# Patient Record
Sex: Male | Born: 1945 | Race: White | Hispanic: No | Marital: Married | Smoking: Current every day smoker
Health system: Southern US, Community
[De-identification: ages and names within clinical notes are randomized; demographics above are authoritative.]

## PROBLEM LIST (undated history)

## (undated) DIAGNOSIS — Z125 Encounter for screening for malignant neoplasm of prostate: Secondary | ICD-10-CM

## (undated) DIAGNOSIS — J449 Chronic obstructive pulmonary disease, unspecified: Secondary | ICD-10-CM

## (undated) DIAGNOSIS — J4489 Other specified chronic obstructive pulmonary disease: Secondary | ICD-10-CM

## (undated) DIAGNOSIS — J309 Allergic rhinitis, unspecified: Secondary | ICD-10-CM

## (undated) DIAGNOSIS — K5792 Diverticulitis of intestine, part unspecified, without perforation or abscess without bleeding: Secondary | ICD-10-CM

## (undated) DIAGNOSIS — R05 Cough: Secondary | ICD-10-CM

## (undated) DIAGNOSIS — C649 Malignant neoplasm of unspecified kidney, except renal pelvis: Secondary | ICD-10-CM

## (undated) DIAGNOSIS — G4700 Insomnia, unspecified: Secondary | ICD-10-CM

## (undated) DIAGNOSIS — F988 Other specified behavioral and emotional disorders with onset usually occurring in childhood and adolescence: Secondary | ICD-10-CM

## (undated) DIAGNOSIS — F329 Major depressive disorder, single episode, unspecified: Secondary | ICD-10-CM

## (undated) DIAGNOSIS — F17200 Nicotine dependence, unspecified, uncomplicated: Secondary | ICD-10-CM

## (undated) DIAGNOSIS — K219 Gastro-esophageal reflux disease without esophagitis: Secondary | ICD-10-CM

## (undated) DIAGNOSIS — J189 Pneumonia, unspecified organism: Secondary | ICD-10-CM

## (undated) DIAGNOSIS — G4730 Sleep apnea, unspecified: Secondary | ICD-10-CM

## (undated) DIAGNOSIS — Z8719 Personal history of other diseases of the digestive system: Secondary | ICD-10-CM

## (undated) DIAGNOSIS — H409 Unspecified glaucoma: Secondary | ICD-10-CM

## (undated) DIAGNOSIS — I10 Essential (primary) hypertension: Secondary | ICD-10-CM

## (undated) DIAGNOSIS — E7800 Pure hypercholesterolemia, unspecified: Secondary | ICD-10-CM

## (undated) DIAGNOSIS — R059 Cough, unspecified: Secondary | ICD-10-CM

## (undated) DIAGNOSIS — F909 Attention-deficit hyperactivity disorder, unspecified type: Secondary | ICD-10-CM

## (undated) DIAGNOSIS — F3289 Other specified depressive episodes: Secondary | ICD-10-CM

## (undated) DIAGNOSIS — F1011 Alcohol abuse, in remission: Secondary | ICD-10-CM

## (undated) DIAGNOSIS — M1990 Unspecified osteoarthritis, unspecified site: Secondary | ICD-10-CM

## (undated) DIAGNOSIS — F419 Anxiety disorder, unspecified: Secondary | ICD-10-CM

## (undated) DIAGNOSIS — Z0000 Encounter for general adult medical examination without abnormal findings: Secondary | ICD-10-CM

## (undated) DIAGNOSIS — Z8489 Family history of other specified conditions: Secondary | ICD-10-CM

## (undated) DIAGNOSIS — K5900 Constipation, unspecified: Secondary | ICD-10-CM

## (undated) DIAGNOSIS — R0602 Shortness of breath: Secondary | ICD-10-CM

## (undated) HISTORY — DX: Unspecified osteoarthritis, unspecified site: M19.90

## (undated) HISTORY — DX: Insomnia, unspecified: G47.00

## (undated) HISTORY — PX: EYE SURGERY: SHX253

## (undated) HISTORY — DX: Other specified depressive episodes: F32.89

## (undated) HISTORY — DX: Pure hypercholesterolemia, unspecified: E78.00

## (undated) HISTORY — DX: Allergic rhinitis, unspecified: J30.9

## (undated) HISTORY — DX: Malignant neoplasm of unspecified kidney, except renal pelvis: C64.9

## (undated) HISTORY — DX: Other specified chronic obstructive pulmonary disease: J44.89

## (undated) HISTORY — DX: Chronic obstructive pulmonary disease, unspecified: J44.9

## (undated) HISTORY — DX: Nicotine dependence, unspecified, uncomplicated: F17.200

## (undated) HISTORY — PX: TONSILLECTOMY: SUR1361

## (undated) HISTORY — DX: Encounter for screening for malignant neoplasm of prostate: Z12.5

## (undated) HISTORY — PX: APPENDECTOMY: SHX54

## (undated) HISTORY — DX: Encounter for general adult medical examination without abnormal findings: Z00.00

## (undated) HISTORY — DX: Cough: R05

## (undated) HISTORY — DX: Gastro-esophageal reflux disease without esophagitis: K21.9

## (undated) HISTORY — PX: OTHER SURGICAL HISTORY: SHX169

## (undated) HISTORY — DX: Other specified behavioral and emotional disorders with onset usually occurring in childhood and adolescence: F98.8

## (undated) HISTORY — DX: Diverticulitis of intestine, part unspecified, without perforation or abscess without bleeding: K57.92

## (undated) HISTORY — DX: Essential (primary) hypertension: I10

## (undated) HISTORY — PX: BACK SURGERY: SHX140

## (undated) HISTORY — DX: Cough, unspecified: R05.9

## (undated) HISTORY — DX: Alcohol abuse, in remission: F10.11

## (undated) HISTORY — DX: Major depressive disorder, single episode, unspecified: F32.9

## (undated) HISTORY — PX: INGUINAL HERNIA REPAIR: SUR1180

---

## 1999-01-18 ENCOUNTER — Encounter: Payer: Self-pay | Admitting: Family Medicine

## 1999-02-26 ENCOUNTER — Ambulatory Visit (HOSPITAL_COMMUNITY): Admission: RE | Admit: 1999-02-26 | Discharge: 1999-02-26 | Payer: Self-pay | Admitting: Neurosurgery

## 1999-02-26 ENCOUNTER — Encounter: Payer: Self-pay | Admitting: Neurosurgery

## 1999-04-13 ENCOUNTER — Encounter: Payer: Self-pay | Admitting: Neurosurgery

## 1999-04-14 ENCOUNTER — Ambulatory Visit (HOSPITAL_COMMUNITY): Admission: RE | Admit: 1999-04-14 | Discharge: 1999-04-15 | Payer: Self-pay | Admitting: Neurosurgery

## 1999-04-14 ENCOUNTER — Encounter: Payer: Self-pay | Admitting: Neurosurgery

## 1999-06-01 ENCOUNTER — Ambulatory Visit (HOSPITAL_COMMUNITY): Admission: RE | Admit: 1999-06-01 | Discharge: 1999-06-01 | Payer: Self-pay | Admitting: Neurosurgery

## 1999-06-01 ENCOUNTER — Encounter: Payer: Self-pay | Admitting: Neurosurgery

## 1999-06-02 ENCOUNTER — Encounter: Payer: Self-pay | Admitting: Pulmonary Disease

## 1999-06-02 ENCOUNTER — Encounter: Admission: RE | Admit: 1999-06-02 | Discharge: 1999-06-02 | Payer: Self-pay | Admitting: Pulmonary Disease

## 1999-06-07 ENCOUNTER — Ambulatory Visit (HOSPITAL_COMMUNITY): Admission: RE | Admit: 1999-06-07 | Discharge: 1999-06-07 | Payer: Self-pay | Admitting: Neurosurgery

## 1999-06-07 ENCOUNTER — Encounter: Payer: Self-pay | Admitting: Neurosurgery

## 1999-06-18 ENCOUNTER — Encounter: Payer: Self-pay | Admitting: Neurosurgery

## 1999-06-18 ENCOUNTER — Ambulatory Visit (HOSPITAL_COMMUNITY): Admission: RE | Admit: 1999-06-18 | Discharge: 1999-06-19 | Payer: Self-pay | Admitting: Neurosurgery

## 1999-12-10 ENCOUNTER — Ambulatory Visit (HOSPITAL_COMMUNITY): Admission: RE | Admit: 1999-12-10 | Discharge: 1999-12-10 | Payer: Self-pay | Admitting: Gastroenterology

## 1999-12-10 ENCOUNTER — Encounter (INDEPENDENT_AMBULATORY_CARE_PROVIDER_SITE_OTHER): Payer: Self-pay | Admitting: Specialist

## 2000-06-21 ENCOUNTER — Ambulatory Visit (HOSPITAL_BASED_OUTPATIENT_CLINIC_OR_DEPARTMENT_OTHER): Admission: RE | Admit: 2000-06-21 | Discharge: 2000-06-21 | Payer: Self-pay | Admitting: Psychiatry

## 2002-01-07 ENCOUNTER — Ambulatory Visit (HOSPITAL_COMMUNITY): Admission: RE | Admit: 2002-01-07 | Discharge: 2002-01-07 | Payer: Self-pay | Admitting: Gastroenterology

## 2002-01-07 ENCOUNTER — Encounter (INDEPENDENT_AMBULATORY_CARE_PROVIDER_SITE_OTHER): Payer: Self-pay | Admitting: *Deleted

## 2002-08-12 ENCOUNTER — Observation Stay (HOSPITAL_COMMUNITY): Admission: EM | Admit: 2002-08-12 | Discharge: 2002-08-13 | Payer: Self-pay | Admitting: Internal Medicine

## 2002-08-19 ENCOUNTER — Inpatient Hospital Stay (HOSPITAL_COMMUNITY): Admission: AD | Admit: 2002-08-19 | Discharge: 2002-08-20 | Payer: Self-pay | Admitting: Gastroenterology

## 2003-11-14 ENCOUNTER — Encounter: Admission: RE | Admit: 2003-11-14 | Discharge: 2003-11-14 | Payer: Self-pay | Admitting: Family Medicine

## 2004-04-16 ENCOUNTER — Encounter (INDEPENDENT_AMBULATORY_CARE_PROVIDER_SITE_OTHER): Payer: Self-pay | Admitting: Specialist

## 2004-04-16 ENCOUNTER — Ambulatory Visit (HOSPITAL_COMMUNITY): Admission: RE | Admit: 2004-04-16 | Discharge: 2004-04-16 | Payer: Self-pay | Admitting: Gastroenterology

## 2005-06-08 ENCOUNTER — Encounter: Admission: RE | Admit: 2005-06-08 | Discharge: 2005-06-08 | Payer: Self-pay | Admitting: Family Medicine

## 2006-07-17 ENCOUNTER — Encounter: Admission: RE | Admit: 2006-07-17 | Discharge: 2006-07-17 | Payer: Self-pay | Admitting: Family Medicine

## 2008-08-08 HISTORY — PX: NEPHRECTOMY: SHX65

## 2008-09-23 ENCOUNTER — Ambulatory Visit: Payer: Self-pay | Admitting: Family Medicine

## 2008-09-23 DIAGNOSIS — Z8719 Personal history of other diseases of the digestive system: Secondary | ICD-10-CM

## 2008-09-23 DIAGNOSIS — F329 Major depressive disorder, single episode, unspecified: Secondary | ICD-10-CM

## 2008-09-23 DIAGNOSIS — G4700 Insomnia, unspecified: Secondary | ICD-10-CM | POA: Insufficient documentation

## 2008-09-23 DIAGNOSIS — M1990 Unspecified osteoarthritis, unspecified site: Secondary | ICD-10-CM | POA: Insufficient documentation

## 2008-09-23 DIAGNOSIS — K219 Gastro-esophageal reflux disease without esophagitis: Secondary | ICD-10-CM

## 2008-09-23 DIAGNOSIS — J309 Allergic rhinitis, unspecified: Secondary | ICD-10-CM | POA: Insufficient documentation

## 2008-09-23 DIAGNOSIS — F1011 Alcohol abuse, in remission: Secondary | ICD-10-CM

## 2008-09-23 DIAGNOSIS — F3289 Other specified depressive episodes: Secondary | ICD-10-CM | POA: Insufficient documentation

## 2008-09-23 DIAGNOSIS — F988 Other specified behavioral and emotional disorders with onset usually occurring in childhood and adolescence: Secondary | ICD-10-CM | POA: Insufficient documentation

## 2008-10-21 ENCOUNTER — Telehealth: Payer: Self-pay | Admitting: Family Medicine

## 2009-01-13 ENCOUNTER — Ambulatory Visit: Payer: Self-pay | Admitting: Family Medicine

## 2009-01-18 ENCOUNTER — Encounter: Admission: RE | Admit: 2009-01-18 | Discharge: 2009-01-18 | Payer: Self-pay | Admitting: Orthopedic Surgery

## 2009-01-26 ENCOUNTER — Encounter: Payer: Self-pay | Admitting: Family Medicine

## 2009-01-29 ENCOUNTER — Ambulatory Visit: Payer: Self-pay | Admitting: Family Medicine

## 2009-02-02 LAB — CONVERTED CEMR LAB
ALT: 17 units/L (ref 0–53)
AST: 17 units/L (ref 0–37)
Basophils Relative: 0 % (ref 0.0–3.0)
Bilirubin, Direct: 0.1 mg/dL (ref 0.0–0.3)
Chloride: 104 meq/L (ref 96–112)
Eosinophils Absolute: 0.2 10*3/uL (ref 0.0–0.7)
Eosinophils Relative: 2.2 % (ref 0.0–5.0)
GFR calc non Af Amer: 91.18 mL/min (ref >60)
HCT: 49.8 % (ref 39.0–52.0)
LDL Cholesterol: 106 mg/dL — ABNORMAL HIGH (ref 0–99)
Lymphs Abs: 1.4 10*3/uL (ref 0.7–4.0)
MCHC: 34.4 g/dL (ref 30.0–36.0)
MCV: 101.7 fL — ABNORMAL HIGH (ref 78.0–100.0)
Monocytes Absolute: 0.7 10*3/uL (ref 0.1–1.0)
Neutrophils Relative %: 73.1 % (ref 43.0–77.0)
Platelets: 177 10*3/uL (ref 150.0–400.0)
Potassium: 3.9 meq/L (ref 3.5–5.1)
Sodium: 143 meq/L (ref 135–145)
TSH: 1.22 microintl units/mL (ref 0.35–5.50)
Total Bilirubin: 1 mg/dL (ref 0.3–1.2)
Total CHOL/HDL Ratio: 4
VLDL: 16.8 mg/dL (ref 0.0–40.0)
Vitamin B-12: 202 pg/mL — ABNORMAL LOW (ref 211–911)
WBC: 8.3 10*3/uL (ref 4.5–10.5)

## 2009-02-03 ENCOUNTER — Ambulatory Visit: Payer: Self-pay | Admitting: Family Medicine

## 2009-02-03 DIAGNOSIS — F17200 Nicotine dependence, unspecified, uncomplicated: Secondary | ICD-10-CM | POA: Insufficient documentation

## 2009-02-04 ENCOUNTER — Encounter: Admission: RE | Admit: 2009-02-04 | Discharge: 2009-02-04 | Payer: Self-pay | Admitting: Family Medicine

## 2009-02-04 DIAGNOSIS — C649 Malignant neoplasm of unspecified kidney, except renal pelvis: Secondary | ICD-10-CM | POA: Insufficient documentation

## 2009-02-12 ENCOUNTER — Encounter: Payer: Self-pay | Admitting: Family Medicine

## 2009-02-16 ENCOUNTER — Encounter: Payer: Self-pay | Admitting: Family Medicine

## 2009-02-17 ENCOUNTER — Ambulatory Visit: Payer: Self-pay | Admitting: Family Medicine

## 2009-02-18 ENCOUNTER — Telehealth: Payer: Self-pay | Admitting: Family Medicine

## 2009-02-27 ENCOUNTER — Telehealth (INDEPENDENT_AMBULATORY_CARE_PROVIDER_SITE_OTHER): Payer: Self-pay | Admitting: *Deleted

## 2009-03-12 ENCOUNTER — Telehealth: Payer: Self-pay | Admitting: Family Medicine

## 2009-03-12 ENCOUNTER — Encounter: Payer: Self-pay | Admitting: Family Medicine

## 2009-03-18 ENCOUNTER — Encounter: Payer: Self-pay | Admitting: Family Medicine

## 2009-03-18 ENCOUNTER — Ambulatory Visit: Payer: Self-pay | Admitting: Nephrology

## 2009-03-23 ENCOUNTER — Ambulatory Visit: Payer: Self-pay | Admitting: Nephrology

## 2009-03-30 ENCOUNTER — Telehealth: Payer: Self-pay | Admitting: Family Medicine

## 2009-04-08 ENCOUNTER — Ambulatory Visit: Payer: Self-pay | Admitting: Oncology

## 2009-04-09 ENCOUNTER — Encounter: Payer: Self-pay | Admitting: Family Medicine

## 2009-04-21 ENCOUNTER — Encounter: Payer: Self-pay | Admitting: Family Medicine

## 2009-04-28 ENCOUNTER — Ambulatory Visit: Payer: Self-pay | Admitting: Urology

## 2009-04-30 ENCOUNTER — Ambulatory Visit: Payer: Self-pay | Admitting: Oncology

## 2009-05-08 ENCOUNTER — Ambulatory Visit: Payer: Self-pay | Admitting: Oncology

## 2009-05-15 ENCOUNTER — Encounter: Payer: Self-pay | Admitting: Family Medicine

## 2009-05-15 ENCOUNTER — Ambulatory Visit: Payer: Self-pay | Admitting: Urology

## 2009-05-19 ENCOUNTER — Telehealth: Payer: Self-pay | Admitting: Family Medicine

## 2009-05-21 ENCOUNTER — Ambulatory Visit: Payer: Self-pay | Admitting: Family Medicine

## 2009-05-25 ENCOUNTER — Telehealth: Payer: Self-pay | Admitting: Family Medicine

## 2009-05-27 ENCOUNTER — Inpatient Hospital Stay: Payer: Self-pay | Admitting: Urology

## 2009-05-31 ENCOUNTER — Encounter: Payer: Self-pay | Admitting: Family Medicine

## 2009-06-04 ENCOUNTER — Encounter: Payer: Self-pay | Admitting: Family Medicine

## 2009-06-25 ENCOUNTER — Encounter: Payer: Self-pay | Admitting: Family Medicine

## 2009-08-08 HISTORY — PX: COLONOSCOPY: SHX174

## 2009-08-26 ENCOUNTER — Telehealth: Payer: Self-pay | Admitting: Family Medicine

## 2009-10-06 ENCOUNTER — Ambulatory Visit: Payer: Self-pay | Admitting: Oncology

## 2009-10-14 ENCOUNTER — Ambulatory Visit: Payer: Self-pay | Admitting: Oncology

## 2009-10-19 ENCOUNTER — Encounter: Payer: Self-pay | Admitting: Family Medicine

## 2009-10-23 ENCOUNTER — Telehealth: Payer: Self-pay | Admitting: Family Medicine

## 2009-11-06 ENCOUNTER — Ambulatory Visit: Payer: Self-pay | Admitting: Oncology

## 2009-11-30 ENCOUNTER — Encounter: Payer: Self-pay | Admitting: Family Medicine

## 2009-12-10 LAB — HM COLONOSCOPY

## 2010-02-02 ENCOUNTER — Encounter: Payer: Self-pay | Admitting: Family Medicine

## 2010-07-20 ENCOUNTER — Ambulatory Visit: Payer: Self-pay | Admitting: Oncology

## 2010-08-04 ENCOUNTER — Telehealth: Payer: Self-pay | Admitting: Family Medicine

## 2010-08-04 ENCOUNTER — Ambulatory Visit
Admission: RE | Admit: 2010-08-04 | Discharge: 2010-08-04 | Payer: Self-pay | Source: Home / Self Care | Attending: Family Medicine | Admitting: Family Medicine

## 2010-08-05 ENCOUNTER — Ambulatory Visit: Admit: 2010-08-05 | Payer: Self-pay | Admitting: Family Medicine

## 2010-08-08 ENCOUNTER — Ambulatory Visit: Payer: Self-pay | Admitting: Oncology

## 2010-08-08 DIAGNOSIS — H409 Unspecified glaucoma: Secondary | ICD-10-CM

## 2010-08-08 HISTORY — DX: Unspecified glaucoma: H40.9

## 2010-09-09 NOTE — Miscellaneous (Signed)
Summary: med list update, refills on diovan  Clinical Lists Changes  Medications: Changed medication from LOSARTAN POTASSIUM 50 MG TABS (LOSARTAN POTASSIUM) 1 tab by mouth daily to DIOVAN 160 MG TABS (VALSARTAN) take one by mouth daily - Signed Rx of DIOVAN 160 MG TABS (VALSARTAN) take one by mouth daily;  #90 x 11;  Signed;  Entered by: Lowella Petties CMA;  Authorized by: Kerby Nora MD;  Method used: Electronically to [facility] [address]*, [address], [place], [place], [place]  [address], Ph: [phone] or [phone], Fax: [phone]    Prescriptions: DIOVAN 160 MG TABS (VALSARTAN) take one by mouth daily  #90 x 11   Entered by:   Lowella Petties CMA   Authorized by:   Kerby Nora MD   Signed by:   Lowella Petties CMA on 10/19/2009   Method used:   Electronically to        [facility]* (retail)       [address]       [place], [place]  [address]       Ph: [phone] or [phone]       Fax: [phone]   RxID:   [id number]   Prior Medications: ADDERALL 20 MG TABS (AMPHETAMINE-DEXTROAMPHETAMINE) Take 1 tablet by mouth four times a day FLURAZEPAM HCL 30 MG CAPS (FLURAZEPAM HCL) Take 2 capsules by mouth at bedtime AMLODIPINE BESYLATE 5 MG TABS (AMLODIPINE BESYLATE) Take 1 tablet by mouth once a day LORAZEPAM 1 MG TABS (LORAZEPAM) Take 1 tablet by mouth two times a day MIRTAZAPINE 15 MG TBDP (MIRTAZAPINE) Take 1 tab by mouth at bedtime DIOVAN 160 MG TABS (VALSARTAN) take one by mouth daily LEXAPRO 20 MG TABS (ESCITALOPRAM OXALATE) Take 1-1/2 tablets by mouth every morning XALATAN 0.005 % SOLN (LATANOPROST) Use 1 drop in both eyes at bedtime DICLOFENAC SODIUM 75 MG TBEC (DICLOFENAC SODIUM) 1 tab by mouth two times a day NICOTROL 10 MG INHA (NICOTINE) 1 inhaler as needed .Marland KitchenMarland Kitchen6-16 per day Current Allergies: No known allergies

## 2010-09-09 NOTE — Progress Notes (Signed)
Summary: Pt request Diovan samples  Phone Note Call from Patient Call back at [phone]   Caller: Patient Call For: Kerby Nora MD Summary of Call: [facility] ran out now on [employer] thru the state. Pt states Diovan has to be preapproved. Pt does not know how to get approved. Pt said he is out of medication for tomorrow. Pt had a pill to take today. Pt said his pharmacy is going to send [place] the paper work for prior authorization of Diovan. Pt wonders if we could give him samples of med. Please advise.  Initial call taken by: Lewanda Rife LPN,  August 26, 2009 8:46 AM  Follow-up for Phone Call        Given samples to last untill prior auth approved.  Follow-up by: Kerby Nora MD,  August 26, 2009 11:43 AM  Additional Follow-up for Phone Call Additional follow up Details #1::        we dont have any.  Additional Follow-up by: Benny Lennert CMA Duncan Dull),  August 26, 2009 2:12 PM    Additional Follow-up for Phone Call Additional follow up Details #2::    let him know that...we can change him to equivalent dose of similar generic med if he is okay with it...losartan 50mg  daily. Follow BPs closely at home.  Follow-up by: Kerby Nora MD,  August 26, 2009 3:40 PM  Additional Follow-up for Phone Call Additional follow up Details #3:: Details for Additional Follow-up Action Taken: discussed with patient and patient agreeable [facility] [address] Additional Follow-up by: Benny Lennert CMA Duncan Dull),  August 26, 2009 3:57 PM  New/Updated Medications: LOSARTAN POTASSIUM 50 MG TABS (LOSARTAN POTASSIUM) 1 tab by mouth daily Prescriptions: LOSARTAN POTASSIUM 50 MG TABS (LOSARTAN POTASSIUM) 1 tab by mouth daily  #30 x 3   Entered and Authorized by:   Kerby Nora MD   Signed by:   Kerby Nora MD on 08/26/2009   Method used:   Electronically to        [facility] (retail)       [address]       [place], [place]   [address]       Ph: [phone] or [phone]       Fax: [phone]   RxID:   [id number]

## 2010-09-09 NOTE — Procedures (Signed)
Summary: Colonoscopy by Dr.Marc Magod,[facility]  Colonoscopy by Dr.Marc Magod,[facility]   Imported By: Beau Fanny 12/09/2009 15:26:33  _____________________________________________________________________  External Attachment:    Type:   Image     Comment:   External Document  Appended Document: Orders Update    Clinical Lists Changes  Observations: Added new observation of HEMOCULTDUE: Not Indicated (12/10/2009 11:52) Added new observation of FLEXSIGDUE: Not Indicated (12/10/2009 11:52) Added new observation of LST COLON DT: 12/10/2009 (12/10/2009 11:52) Added new observation of COLONNXTDUE: 12/11/2014 (12/10/2009 11:52) Added new observation of COLONOSCOPY: poilyps, hyperplastic (12/10/2009 11:52) Added new observation of TDBOOSTDUE: 02/04/2019 (12/10/2009 11:52)      Flex Sig Next Due:  Not Indicated Last Colonoscopy:  Diverticulosis (08/09/2007 10:21:46 AM) Colonoscopy Result Date:  12/10/2009 Colonoscopy Result:  poilyps, hyperplastic Colonoscopy Next Due:  5 yr Hemoccult Next Due:  Not Indicated

## 2010-09-09 NOTE — Progress Notes (Signed)
Summary: Rx Diclofenac  Phone Note Refill Request Call back at [phone] Message from:  Madilyn Hook on October 23, 2009 4:15 PM  Refills Requested: Medication #1:  DICLOFENAC SODIUM 75 MG TBEC 1 tab by mouth two times a day   Last Refilled: 02/18/2009 Received faxed refill request, please advise   Method Requested: Electronic Initial call taken by: Linde Gillis CMA Duncan Dull),  October 23, 2009 4:16 PM  Follow-up for Phone Call        Notify pt with kidney issue..do not recommend using this medication regularly.Marland Kitchentylenol safer for kidneys.  Denied refill.  Follow-up by: Kerby Nora MD,  October 23, 2009 4:21 PM  Additional Follow-up for Phone Call Additional follow up Details #1::        Patient advised as instructed.   Additional Follow-up by: Linde Gillis CMA Duncan Dull),  October 23, 2009 4:33 PM

## 2010-09-09 NOTE — Letter (Signed)
Summary: [facility]   Imported By: Lanelle Bal 02/10/2010 12:00:27  _____________________________________________________________________  External Attachment:    Type:   Image     Comment:   External Document

## 2010-09-09 NOTE — Progress Notes (Signed)
Summary: sinus issues  Phone Note Call from Patient Call back at Work Phone [phone]   Caller: Patient Call For: Dr. Patsy Lager  Summary of Call: Patient has an appt scheduled for tomorrow because there are no appts. available for today. He is having alot of sinus problems and wife says that patient is in alot of pain in his teeth up to the bridge of his nose because of his sinuses. She says that he was up most of the nght with the pain. He has taken tylenol to try and help ease the pain, but it isn't helping. She is asking if there is anything he can take to help him until he is seen tomorrow. Uses [facility] [address] if needed.  Initial call taken by: Melody Comas,  August 04, 2010 9:46 AM  Follow-up for Phone Call        i can see him at 4:15 - please add to my schedule.  take tylenol or advil until I see him. Hannah Beat MD  August 04, 2010 9:55 AM   Patient advised, appt made for today at 4:15.  Follow-up by: Melody Comas,  August 04, 2010 9:59 AM

## 2010-09-09 NOTE — Assessment & Plan Note (Signed)
Summary: sinus issues/alc   Vital Signs:  Patient profile:   65 year old male Height:      66.75 inches Weight:      163.75 pounds BMI:     25.93 Temp:     98.3 degrees F oral Pulse rate:   80 / minute Pulse rhythm:   regular BP sitting:   120 / 70  (left arm) Cuff size:   regular  Vitals Entered By: Benny Lennert CMA Duncan Dull) (August 04, 2010 4:08 PM)  History of Present Illness: Chief complaint ? sinus problems  65 year old male:   consultation was I was asked to see urgently for "sinus problems ". My nurse asked me to evaluate him urgently for left-sided facial swelling.  On evaluation, the patient has large left sided facial upper maxillary swelling. He is in severe pain. Doesn't history of a tooth abscess in the past. He also has a history of multiple sinusitis infections.  Is not febrile, but he is chilly, and is wearing 3 layers of clothing.  ROS: As above, no nausea, vomiting, diarrhea. Some tooth pain, some nasal symptoms. No cough.  Allergies (verified): No Known Drug Allergies  Past History:  Past medical, surgical, family and social histories (including risk factors) reviewed, and no changes noted (except as noted below).  Past Medical History: Reviewed history from 09/23/2008 and no changes required. Osteoarthritis, hands Depression GERD Allergic rhinitis  Past Surgical History: Reviewed history from 09/23/2008 and no changes required. neck fusion diskectomy B hernia, inguinal  Appendectomy  Family History: Reviewed history from 09/23/2008 and no changes required. father: lung cancer, PMR, high chol mother: lung cancer, high chol brother: CAD, MI age 30  Social History: Reviewed history from 09/23/2008 and no changes required. 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 Head:  Normocephalic and atraumatic without obvious abnormalities. No apparent alopecia or balding. Mouth:  left-sided upper maxillary area is exquisitely tender to palpate caudal to the tooth line on the left side. This is greater on the outside but still somewhat present on the inside. There is no fluctuance. Neck:  No deformities, masses, or tenderness noted. Lungs:  Normal respiratory effort, chest expands symmetrically. Lungs are clear to auscultation, no crackles or wheezes. Heart:  Normal rate and regular rhythm. S1 and S2 normal without gallop, murmur, click, rub or other extra sounds.   Impression & Recommendations:  Problem # 1:  ABSCESS, TOOTH (ICD-522.5) Assessment New consistent with dental abscess.  I called the patient's dentist in the office, and arrange for a followup tomorrow morning at 8:15 AM.  Vicodin p.r.n. for pain.  Start penicillin.  Orders: Ketorolac-Toradol 15mg  (Z6109) Admin of Therapeutic Inj  intramuscular or subcutaneous (60454)  Complete Medication List: 1)  Adderall 20 Mg Tabs (Amphetamine-dextroamphetamine) .... Take 1 tablet by mouth four times a day 2)  Flurazepam Hcl 30 Mg Caps (Flurazepam hcl) .... Take 2 capsules by mouth at bedtime 3)  Amlodipine Besylate 5 Mg Tabs (Amlodipine besylate) .... Take 1 tablet by mouth once a day 4)  Lorazepam 1 Mg Tabs (Lorazepam) .... Take 1 tablet by mouth two times a day 5)  Mirtazapine 15 Mg Tbdp (Mirtazapine) .... Take 1 tab by mouth at bedtime  6)  Diovan 160 Mg Tabs (Valsartan) .... Take one by mouth daily 7)  Lexapro 20 Mg Tabs (Escitalopram oxalate) .... Take 1-1/2 tablets by mouth every morning 8)  Xalatan 0.005 % Soln (Latanoprost) .... Use 1 drop in both eyes at bedtime 9)  Nicotrol 10 Mg Inha (Nicotine) .Marland Kitchen.. 1 inhaler as needed .Marland KitchenMarland Kitchen6-16 per  day 10)  Penicillin V Potassium 500 Mg Tabs (Penicillin v potassium) .Marland Kitchen.. 1 by mouth 4 times daily 11)  Hydrocodone-acetaminophen 5-500 Mg Tabs (Hydrocodone-acetaminophen) .Marland Kitchen.. 1 - 2 tabs by mouth q 6 hours Prescriptions: HYDROCODONE-ACETAMINOPHEN 5-500 MG TABS (HYDROCODONE-ACETAMINOPHEN) 1 - 2 tabs by mouth q 6 hours  #30 x 0   Entered and Authorized by:   Hannah Beat MD   Signed by:   Hannah Beat MD on 08/04/2010   Method used:   Print then Give to Patient   RxID:   [id number] PENICILLIN V POTASSIUM 500 MG TABS (PENICILLIN V POTASSIUM) 1 by mouth 4 times daily  #40 x 0   Entered and Authorized by:   Hannah Beat MD   Signed by:   Hannah Beat MD on 08/04/2010   Method used:   Print then Give to Patient   RxID:   [id number]    Medication Administration  Injection # 1:    Medication: Ketorolac-Toradol 15mg     Diagnosis: ABSCESS, TOOTH (ICD-522.5)    Route: IM    Site: R deltoid    Exp Date: 10/07/2011    Lot #: [id number]    Mfr: [employer]is    Patient tolerated injection without complications    Given by: Benny Lennert CMA Duncan Dull) (August 04, 2010 4:52 PM)  Orders Added: 1)  Ketorolac-Toradol 15mg  [J1885] 2)  Admin of Therapeutic Inj  intramuscular or subcutaneous [96372] 3)  Est. Patient Level IV [41324]    Current Allergies (reviewed today): No known allergies

## 2010-09-09 NOTE — Progress Notes (Signed)
Summary: Prior Authorization for Diovan  Phone Note Other Incoming   Caller: Cyndra Numbers [phone] Summary of Call: Received faxed form from pharmacy stating that Diovan needs prior authorization.  Called [facility] [phone], spoke with Myrene Buddy who advised me that she will grandfather the patient in since he has been on Diovan starting before 08/08/2009.   Initial call taken by: Linde Gillis CMA Duncan Dull),  August 26, 2009 11:26 AM Caller: [facility] [address] [phone]* Call For: Dr. Ermalene Searing  Summary of Call: Received faxed form from pharmacy saying that Diovan needs prior authorization.  Called [phone] and spoke to Myrene Buddy      Appended Document: Prior Authorization for Diovan Myrene Buddy said that she would call the pharmacy and make sure that it would be approved.

## 2010-09-28 ENCOUNTER — Other Ambulatory Visit: Payer: Self-pay | Admitting: Family Medicine

## 2010-09-28 ENCOUNTER — Encounter (INDEPENDENT_AMBULATORY_CARE_PROVIDER_SITE_OTHER): Payer: Self-pay | Admitting: *Deleted

## 2010-09-28 ENCOUNTER — Telehealth (INDEPENDENT_AMBULATORY_CARE_PROVIDER_SITE_OTHER): Payer: Self-pay | Admitting: *Deleted

## 2010-09-28 ENCOUNTER — Other Ambulatory Visit (INDEPENDENT_AMBULATORY_CARE_PROVIDER_SITE_OTHER): Payer: PRIVATE HEALTH INSURANCE

## 2010-09-28 DIAGNOSIS — E7800 Pure hypercholesterolemia, unspecified: Secondary | ICD-10-CM | POA: Insufficient documentation

## 2010-09-28 LAB — BASIC METABOLIC PANEL WITH GFR
BUN: 16 mg/dL (ref 6–23)
CO2: 28 meq/L (ref 19–32)
Calcium: 9 mg/dL (ref 8.4–10.5)
Chloride: 103 meq/L (ref 96–112)
Creatinine, Ser: 1.1 mg/dL (ref 0.4–1.5)
GFR: 71.19 mL/min (ref >60.00)
Glucose, Bld: 98 mg/dL (ref 70–99)
Potassium: 4.5 meq/L (ref 3.5–5.1)
Sodium: 139 meq/L (ref 135–145)

## 2010-09-28 LAB — HEPATIC FUNCTION PANEL
ALT: 18 U/L (ref 0–53)
AST: 19 U/L (ref 0–37)
Albumin: 4.2 g/dL (ref 3.5–5.2)
Alkaline Phosphatase: 61 U/L (ref 39–117)
Bilirubin, Direct: 0.2 mg/dL (ref 0.0–0.3)
Total Bilirubin: 0.7 mg/dL (ref 0.3–1.2)
Total Protein: 6.7 g/dL (ref 6.0–8.3)

## 2010-09-28 LAB — LIPID PANEL
Cholesterol: 156 mg/dL (ref 0–200)
HDL: 41 mg/dL (ref >39.00)
LDL Cholesterol: 104 mg/dL — ABNORMAL HIGH (ref 0–99)
Total CHOL/HDL Ratio: 4
Triglycerides: 57 mg/dL (ref 0.0–149.0)
VLDL: 11.4 mg/dL (ref 0.0–40.0)

## 2010-10-04 ENCOUNTER — Encounter: Payer: Self-pay | Admitting: Family Medicine

## 2010-10-04 ENCOUNTER — Encounter (INDEPENDENT_AMBULATORY_CARE_PROVIDER_SITE_OTHER): Payer: PRIVATE HEALTH INSURANCE | Admitting: Family Medicine

## 2010-10-04 ENCOUNTER — Other Ambulatory Visit: Payer: Self-pay | Admitting: Family Medicine

## 2010-10-04 DIAGNOSIS — R05 Cough: Secondary | ICD-10-CM | POA: Insufficient documentation

## 2010-10-04 DIAGNOSIS — I10 Essential (primary) hypertension: Secondary | ICD-10-CM | POA: Insufficient documentation

## 2010-10-04 DIAGNOSIS — F17200 Nicotine dependence, unspecified, uncomplicated: Secondary | ICD-10-CM

## 2010-10-04 DIAGNOSIS — J449 Chronic obstructive pulmonary disease, unspecified: Secondary | ICD-10-CM | POA: Insufficient documentation

## 2010-10-04 DIAGNOSIS — Z125 Encounter for screening for malignant neoplasm of prostate: Secondary | ICD-10-CM

## 2010-10-04 DIAGNOSIS — Z0000 Encounter for general adult medical examination without abnormal findings: Secondary | ICD-10-CM

## 2010-10-04 DIAGNOSIS — J4489 Other specified chronic obstructive pulmonary disease: Secondary | ICD-10-CM | POA: Insufficient documentation

## 2010-10-04 LAB — CONVERTED CEMR LAB
Cholesterol, target level: 200 mg/dL
HDL goal, serum: 40 mg/dL
LDL Goal: 130 mg/dL

## 2010-10-05 NOTE — Progress Notes (Signed)
----   Converted from flag ---- ---- 09/28/2010 8:12 AM, Kerby Nora MD wrote: CMET, lipids, Dx 272.0  ---- 09/27/2010 3:51 PM, Mills Koller wrote: This patient is scheduled for CPX with you, I need lab orders for Tuesday morning  with dx, please. Thanks, Terri ------------------------------

## 2010-10-14 NOTE — Assessment & Plan Note (Signed)
Summary: CPX/CLE   [facility]   Vital Signs:  Patient profile:   65 year old Pedro [name] Height:      66.75 inches Weight:      163.50 pounds BMI:     25.89 Temp:     97.6 degrees F oral Pulse rate:   80 / minute Pulse rhythm:   regular BP sitting:   110 / 60  (left arm) Cuff size:   regular  Vitals Entered By: Benny Lennert CMA Duncan Dull) (October 04, 2010 1:57 PM)  History of Present Illness: Chief complaint cpx   65 year old Pedro [name] with recent history of renal cancer dx in 2010, s/p nephrectomy on right here for CPX.  HTN, on amlodipine and diovan. well controlled.   Depression, insomnia and ADD treat by Dr. Evelene Croon: moderate control on adderall, pristiq and amitryptiline He has lost job. Seeing Dr. Evelene Croon every 6 months.  NO SI.  Hypertension History:      He denies headache, chest pain, palpitations, peripheral edema, syncope, and side effects from treatment.        Positive major cardiovascular risk factors include Pedro [name] age 67 years old or older, hyperlipidemia, hypertension, and current tobacco user.    Lipid Management History:      Positive NCEP/ATP III risk factors include Pedro [name] age 70 years old or older, current tobacco user, and hypertension.        His compliance with the TLC diet is good.      Preventive Screening-Counseling & Management  Alcohol-Tobacco     Smoking Status: current     Smoking Cessation Counseling: yes     Packs/Day: 1.5-5     Year Started: age 28     Pack years: 34 or more  Caffeine-Diet-Exercise     Diet Comments: moderate, fast food     Diet Counseling: to improve diet; diet is suboptimal     Does Patient Exercise: no     Exercise Counseling: to improve exercise regimen  Problems Prior to Update: 1)  Hypercholesterolemia  (ICD-272.0) 2)  Abscess, Tooth  (ICD-522.5) 3)  Preoperative Examination  (ICD-V72.84) 4)  Leg Pain, Right  (ICD-729.5) 5)  Renal Cell Cancer  (ICD-189.0) 6)  Tobacco Abuse  (ICD-305.1) 7)  Abd/pelvic Swelling  Mass/lump Oth Spec Site  (ICD-789.39) 8)  Screening For Lipoid Disorders  (ICD-V77.91) 9)  Other Malaise and Fatigue  (ICD-780.79) 10)  Special Screening Malignant Neoplasm of Prostate  (ICD-V76.44) 11)  Shoulder Pain, Right  (ICD-719.41) 12)  Paresthesia  (ICD-782.0) 13)  Alcohol Abuse, 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 Take 1 Tablet By Mouth Four Times A Day 2)  Flurazepam Hcl 30 Mg Caps (Flurazepam Hcl) .... Take 2 Capsules By Mouth At Bedtime 3)  Amlodipine Besylate 5 Mg Tabs (Amlodipine Besylate) .... Take 1 Tablet By Mouth Once A Day 4)  Lorazepam 1 Mg Tabs (Lorazepam) .... Take 1 Tablet By Mouth Two Times A Day 5)  Diovan 160 Mg Tabs (Valsartan) .... Take One By Mouth Daily 6)  Amitriptyline Hcl 50 Mg Tabs (Amitriptyline Hcl) .... Take One Tablet At Night 7)  Pristiq 50 Mg Xr24h-Tab (Desvenlafaxine Succinate) .... Take One Tablet in Am  Allergies (verified): No Known Drug  Allergies  Past History:  Past medical, surgical, family and social histories (including risk factors) reviewed, and no changes noted (except as noted below).  Past Medical History: Reviewed history from 09/23/2008 and no changes required. Osteoarthritis, hands Depression GERD Allergic rhinitis  Past Surgical History: neck fusion diskectomy B hernia, inguinal  Appendectomy nephrectomy for renal cell cancer 2010  Family History: Reviewed history from 09/23/2008 and no changes required. father: lung cancer, PMR, high chol mother: lung cancer, high chol brother: CAD, MI age 31  Social History: Reviewed history from 09/23/2008 and no changes required. 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 CV:  Denies chest pain or discomfort. Resp:  Denies shortness of breath. GI:  Denies abdominal pain and bloody stools. GU:  Denies dysuria.  Physical Exam  General:  Well-developed,well-nourished,in no acute distress; alert,appropriate and cooperative throughout examination Ears:  External ear exam shows no significant lesions or deformities.  Otoscopic examination reveals clear canals, tympanic membranes are intact bilaterally without bulging, retraction, inflammation or discharge. Hearing is grossly normal bilaterally. Nose:  External nasal examination shows no deformity or inflammation. Nasal mucosa are pink and moist without lesions or exudates. Mouth:  poor dentition, partials, tobacco stains Neck:  no carotid bruit or thyromegaly no cervical or supraclavicular lymphadenopathy  Lungs:  Normal respiratory effort, chest expands symmetrically. Lungs are clear to auscultation, no crackles or wheezes. Heart:  Normal rate and regular rhythm. S1 and S2 normal without gallop, murmur, click, rub or other extra sounds. Abdomen:  Bowel sounds positive,abdomen soft and non-tender without masses, organomegaly or hernias noted. Genitalia:  Testes bilaterally descended without nodularity, tenderness or masses. No scrotal masses or lesions. No penis lesions or urethral discharge. Prostate:  Prostate gland firm and smooth, no enlargement, nodularity, tenderness, mass, asymmetry or induration. Pulses:  R and L posterior tibial pulses are full and equal bilaterally  Extremities:  no edema Neurologic:  No cranial nerve deficits noted. Station and gait are normal. Plantar reflexes are down-going bilaterally. DTRs are  symmetrical throughout. Sensory, motor and coordinative functions appear intact. Skin:  Intact without suspicious lesions or rashes Psych:  Cognition and judgment appear intact. Alert and cooperative with normal attention span and concentration. No apparent delusions, illusions, hallucinations   Impression & Recommendations:  Problem # 1:  Preventive Health Care (ICD-V70.0) The patient's preventative maintenance and recommended screening tests for an annual wellness exam were reviewed in full today. Brought up to date unless services declined.  Counselled on the importance of diet, exercise, and its role in overall health and mortality. The patient's FH and SH was reviewed, including their home life, tobacco status, and drug and alcohol status.     Problem # 2:  HYPERTENSION, ESSENTIAL (ICD-401.9)  Well controlled. Continue current medication.  His updated medication list for this problem includes:    Amlodipine Besylate 5 Mg Tabs (Amlodipine besylate) .Marland Kitchen... Take 1 tablet by mouth once a day    Diovan 160 Mg Tabs (Valsartan) .Marland Kitchen... Take one by mouth daily  BP today: 110/60 Prior BP: 120/70 (08/04/2010)  10 Yr Risk Heart Disease: 22 %  Labs Reviewed: K+: 4.5 (09/28/2010) Creat: : 1.1 (09/28/2010)   Chol: 156 (09/28/2010)   HDL: 41.00 (09/28/2010)   LDL: 104 (09/28/2010)   TG: 57.0 (09/28/2010)  Problem #  3:  HYPERCHOLESTEROLEMIA (ICD-272.0) Well controlled. Work on [employer] and healthy eating habits.  Problem # 4:  DEPRESSION (ICD-311) Per pshyc.  The following medications were removed from the medication list:    Mirtazapine 15 Mg Tbdp (Mirtazapine) .Marland Kitchen... Take 1 tab by mouth at bedtime    Lexapro 20 Mg Tabs (Escitalopram oxalate) .Marland Kitchen... Take 1-1/2 tablets by mouth every morning His updated medication list for this problem includes:    Lorazepam 1 Mg Tabs (Lorazepam) .Marland Kitchen... Take 1 tablet by mouth two times a day    Amitriptyline Hcl 50 Mg Tabs (Amitriptyline hcl) .Marland Kitchen... Take one  tablet at night    Pristiq 50 Mg Xr24h-tab (Desvenlafaxine succinate) .Marland Kitchen... Take one tablet in am  Problem # 5:  TOBACCO ABUSE (ICD-305.1)  The following medications were removed from the medication list:    Nicotrol 10 Mg Inha (Nicotine) .Marland Kitchen... 1 inhaler as needed .Marland KitchenMarland Kitchen6-16 per day  Orders: Spirometry w/Graph (94010): Showed COPD moderate.. pt not interested in spiriva at this time. Will consider though as well as xonsider tobacco cessation.  Tobacco use cessation intermediate 3-10 minutes (04540)  Encouraged smoking cessation and discussed different methods for smoking cessation.  Info on chantix given.   Complete Medication List: 1)  Adderall 20 Mg Tabs (Amphetamine-dextroamphetamine) .... Take 1 tablet by mouth four times a day 2)  Flurazepam Hcl 30 Mg Caps (Flurazepam hcl) .... Take 2 capsules by mouth at bedtime 3)  Amlodipine Besylate 5 Mg Tabs (Amlodipine besylate) .... Take 1 tablet by mouth once a day 4)  Lorazepam 1 Mg Tabs (Lorazepam) .... Take 1 tablet by mouth two times a day 5)  Diovan 160 Mg Tabs (Valsartan) .... Take one by mouth daily 6)  Amitriptyline Hcl 50 Mg Tabs (Amitriptyline hcl) .... Take one tablet at night 7)  Pristiq 50 Mg Xr24h-tab (Desvenlafaxine succinate) .... Take one tablet in am  Other Orders: TLB-PSA (Prostate Specific Antigen) (84153-PSA)  Hypertension Assessment/Plan:      The patient's hypertensive risk group is category B: At least one risk factor (excluding diabetes) with no target organ damage.  His calculated 10 year risk of coronary heart disease is 22 %.  Today's blood pressure is 110/60.  His blood pressure goal is < 140/90.  Lipid Assessment/Plan:      Based on NCEP/ATP III, the patient's risk factor category is "2 or more risk factors and a calculated 10 year CAD risk of > 20%".  The patient's lipid goals are as follows: Total cholesterol goal is 200; LDL cholesterol goal is 130; HDL cholesterol goal is 40; Triglyceride goal is 150.  His  LDL cholesterol goal has been met.    Patient Instructions: 1)  Work on healthy eating, regular exercise. 2)  Look into whether insurance covers shingles vaccine. 3)  Please schedule a follow-up appointment in 1 year.  Prescriptions: DIOVAN 160 MG TABS (VALSARTAN) take one by mouth daily  #30 x 11   Entered and Authorized by:   Kerby Nora MD   Signed by:   Kerby Nora MD on 10/04/2010   Method used:   Electronically to        [facility] (retail)       [address]       [place], [place]  [address]       Ph: [phone]       Fax: [phone]   RxID:   [id number] AMLODIPINE BESYLATE 5 MG TABS (AMLODIPINE BESYLATE) Take 1 tablet by mouth once a day  #30  Tablet x 11   Entered and Authorized by:   Kerby Nora MD   Signed by:   Kerby Nora MD on 10/04/2010   Method used:   Electronically to        [facility] (retail)       [address]       [place], [place]  [address]       Ph: [phone]       Fax: [phone]   RxID:   [id number]    Orders Added: 1)  Spirometry w/Graph [94010] 2)  TLB-PSA (Prostate Specific Antigen) [76283-TDV] 3)  Est. Patient 40-64 years [99396] 4)  Tobacco use cessation intermediate 3-10 minutes [99406]    Current Allergies (reviewed today): No known allergies   Herpes Zoster Next Due:  Refused

## 2010-12-24 NOTE — Op Note (Signed)
NAME:  Pedro [name], Pedro [name]                            ACCOUNT NO.:  [id number]   MEDICAL RECORD NO.:  [id number]                   PATIENT TYPE:  AMB   LOCATION:  ENDO                                 FACILITY:  [facility]   PHYSICIAN:  Petra Kuba, M.D.                 DATE OF BIRTH:  [birth year]   DATE OF PROCEDURE:  04/16/2004  DATE OF DISCHARGE:                                 OPERATIVE REPORT   PROCEDURE:  Colonoscopy.   INDICATION:  Bright red blood per rectum.  The patient with history of colon  polyps.  Consent was signed after risks, benefits, methods, options  thoroughly discussed multiple times in the past.   MEDICINES USED:  1.  Demerol 100.  2.  Versed 10.   DESCRIPTION OF PROCEDURE:  Rectal inspection was pertinent for external  hemorrhoids.  Digital exam was negative.  The video colonoscope was  inserted, easily advanced around the colon to the cecum.  This did not  require any abdominal pressure or any position changes.  Other than both  left and right scattered diverticula, no abnormalities were seen.  On  insertion, the cecum was identified by the appendiceal orifice and the  ileocecal valve.  The prep was fairly adequate, did require some washing and  suctioning for adequate visualization but on slow withdrawal through the  colon, other than the diverticula mentioned above, no abnormalities were  seen until we withdrew back to the distal sigmoid where a tiny hyperplastic-  appearing polyp was seen and was hot biopsied x 1.  Back in the rectum,  three other tiny polyps were seen and were hot biopsied x 1 or 2 and all put  in the same container.  Anorectal pull-through and retroflexion confirmed  some small to medium-sized hemorrhoids.  The scope was straightened and  readvanced a short ways up the left side of the colon; air and water were  suctioned, scope removed.  The patient tolerated the procedure well.  There  was no obvious immediate complication.   ENDOSCOPIC  DIAGNOSES:  1.  Internal/external hemorrhoids.  2.  Left and right diverticula.  3.  Three rectal tiny polyps, hot biopsied.  4.  One distal sigmoid polyp, hot biopsied.  5.  Otherwise, within normal limits to the cecum.   PLAN:  Await pathology to determine future colonic screening but probably  can wait 5 years and continue work-up with an EGD.  Please see that  dictation for other work-up and plans and recommendations.                                               Petra Kuba, M.D.    MEM/MEDQ  D:  04/16/2004  T:  04/16/2004  Job:  [id number]  cc:   [place]ale [place]. Andrey Campanile, M.D.  [name]  [place]  [place] [address]  Fax: [phone]

## 2010-12-24 NOTE — Procedures (Signed)
[facility]  Patient:    Pedro [name], Pedro [name] Visit Number: [id number] MRN: [id number]          Service Type: END Location: ENDO Attending Physician:  Nelda Marseille Dictated by:   Petra Kuba, M.D. Proc. Date: 01/07/02 Admit Date:  01/07/2002   CC:         Duffy Rhody C. Andrey Campanile, M.D.   Procedure Report  PROCEDURE:  Colonoscopy with polypectomy.  ENDOSCOPIST:  Petra Kuba, M.D.  INDICATION:  The patient with history of colon polyps due for repeat screening.  Consent was signed after risks, benefits, methods, and options were thoroughly discussed in the office.  MEDICINES USED:  Demerol 80, Versed 9.  DESCRIPTION OF PROCEDURE:  Rectal inspection was pertinent for external hemorrhoids.  Digital exam was negative.  The videocolonoscope was inserted, easily advanced around the colon to the cecum.  The prep was only fair.  There were diverticula throughout.  The cecum was identified by the appendiceal orifice and the ileocecal valve.  To advance through the cecum did require some abdominal pressure but no position changes.  In the cecal pole, just next to the appendix was a small polyp which was carefully snared and using a setting of 15/15 minimal cautery was done as we removed the polyp, mostly just cutting through it, with suctioning through the scope, and collected in the trap.  There were no signs of bleeding.  Also, in the cecum, another tiny polyp was seen and again using 15/15 with minimal cautery, one hot biopsy was done.  In the midascending behind a fold was another 3-4 mm polyp.  To snare it required rolling him on his back, and we able to snare it, electrocautery applied, and it was removed, suctioned through the scope, and collected in the trap.  We did increase the cautery for this polyp to 20/20.  Other than the left-sided and right-sided diverticular, one other tiny midtransverse polyp was seen and was hot biopsied x1.  No other  abnormalities were seen as we slowly withdrew back to the rectum.  Unfortunately with the fair prep throughout, lots of washing and suctioning were done, but there was a moderate amount of stool that was adherent to the wall which could not be washed off. In the rectum, there was some formed stool which made visualization very difficult and could not be washed and suctioned.  The scope was retroflexed revealing some tiny internal hemorrhoids.  No other abnormalities were seen. The scope was drained and readvanced a short ways up the left side of the colon.  Air and water were suctioned.  The scope was removed.  The patient tolerated the procedure well.  There was no obvious immediate complication.  ENDOSCOPIC DIAGNOSES: 1. Internal and external hemorrhoids. 2. Left and right diverticula. 3. Fair prep. 4. Two tiny cecal and transverse polyps hot biopsied, one in the    cecum at a setting of 15/15. 5. Two small polyps in the cecum and ascending snare, one in the    cecum using 15/15 and the other 20/20. 6. Otherwise within normal limits to the cecum.  PLAN:  Yearly rectals and guaiacs per Dr. Andrey Campanile.  Will wait on the pathology but would proceed with colonoscopy check sooner based on the prep and use GoLYTELY/magnesium citrate the next time.  Happy to see back sooner p.r.n. Dictated by:   Petra Kuba, M.D. Attending Physician:  Nelda Marseille DD:  01/07/02 TD:  01/08/02 Job: [id number]  [id number]

## 2010-12-24 NOTE — Procedures (Signed)
[facility]  Patient:    Pedro [name], Pedro [name]                         MRN: [id number] Proc. Date: 12/10/99 Adm. Date:  [id number] Disc. Date: [id number] Attending:  Nelda Marseille CC:         Petra Kuba, M.D.             Stanley C. Andrey Campanile, M.D.                           Procedure Report  PROCEDURE: Colonoscopy with polypectomy.  INDICATIONS:  Bright red blood per rectum due for colonic screening.  Consent was signed after risks, benefits, and options were thoroughly discussed in the office.  MEDICATIONS:  Demerol 50, versed 6.  PROCEDURE:  Rectal inspection is pertinent for obvious external hemorrhoids. Digital exam was negative.  Video colonoscope was inserted and easily advanced around the colon to the cecum.  On insertion lots of diverticula were seen both on the right and the left side.  Also on insertion a 1 cm mid transverse polyp was seen.  No other abnormalities were seen as we advanced through the cecum which was identified by the appendiceal orifice and the ileocecal valve.  A ______ scope was inserted a short ways in the terminal ileum which was normal.  ______ documentation was obtained.  The scope was slowly withdrawn.  In the cecum a 5 mm sessile polyp was seen and was carefully hot biopsied on a setting of 20/20 x 3 and put in the first container.  The scope was slowly withdrawn back to the mid transverse polyp.  No other polyp or lesions were seen but there were some right-sided diverticula.  This polyp was seen, snared, electrocautery applied and the polyp was removed.  There seemed to be some residual adenomatous tissue at the base which was snared after changing his position. Electrocautery was applied and this piece was removed.  This piece was suctioned through the scope and collected in a trap but the other piece was too big to suction through the scope so we suctioned onto the head of the scope and the scope was removed  and recovered.  The scope was reinserted to the polypectomy site which had no bleeding or obvious residual polyp.  The scope was further withdrawn.  There were some left-sided diverticula.  The prep was fairly adequate but because of the diverticula we did require lots of washing and suctioning.  In the mid sigmoid a 5 mm polyp was seen, snared, and electrocautery applied and it was suctioned through the scope and collected in the trap.  There seemed to be some residual adenomatous polypoid tissue at the base which was hot biopsied x 1.  This was put in the third container.  The scope was further withdrawn back to the distal sigmoid and a tiny polyp was seen and hot biopsied x 1.  Once back in the rectum the scope was retroflexed which revealed 2 small tiny polyps, probably hyperplastic, but they were each hot biopsied x 1 as well and put in the fourth container with the distal sigmoid polyp as well.  There were some internal hemorrhoids seen on retroflexion.  The scope was straightened, readvanced a short ways up the sigmoid area with suction scope removed.  The patient tolerated the procedure well.  There was no obvious immediate complication.  ENDOSCOPIC DIAGNOSIS: 1. internal and external hemorrhoids. 2. Left and right diverticula. 3. Three tiny rectal distal sigmoid polyps status post hot biopsy. 4. Sigmoid 6 mm polyp status post snare and hot biopsy of the base. 5. 1 cm semisessile mid transverse polyp status post snare x 2. 6. Cecal 5 mm sessile polyp hot biopsied x 3 at a setting of 20/20. 7. Otherwise within normal limits to the terminal ileum. 8. Await pathology.  Consider GoLYTELY prep next time.  Probably will recheck    in 2-3 years. 9. Two week post polypectomy orders and see back in the office in 6-8 weeks to    recheck symptoms, guaiacs and make sure no further workup plans are needed. DD:  12/10/99 TD:  12/13/99 Job: [id number]

## 2010-12-24 NOTE — Op Note (Signed)
NAME:  Pedro [name], Pedro [name]                            ACCOUNT NO.:  [id number]   MEDICAL RECORD NO.:  [id number]                   PATIENT TYPE:  AMB   LOCATION:  ENDO                                 FACILITY:  [facility]   PHYSICIAN:  Petra Kuba, M.D.                 DATE OF BIRTH:  [birth year]   DATE OF PROCEDURE:  04/16/2004  DATE OF DISCHARGE:                                 OPERATIVE REPORT   PROCEDURE:  EGD.   INDICATION:  Abnormal MRI, normal sensation in his throat, history of hiatal  hernia.  Consent was signed after risks, benefits, methods, options  thoroughly discussed multiple times in the past.   MEDICINES USED ADDITIONALLY:  Versed 2.   DESCRIPTION OF PROCEDURE:  The video endoscope was inserted by direct  vision.  On insertion, only a minimal look was seen at the posterior  pharynx, but no abnormalities were seen.  The scope passed into the  esophagus and advanced to the distal esophagus which was pertinent for a  tiny hiatal hernia with some very minimal distal esophagus which was  pertinent for a tiny hiatal hernia with some very minimal distal  esophagitis.  The scope passed into the stomach, advanced through the  antrum, pertinent for some minimal antritis, through a normal pylorus, into  a normal duodenal bulb and around the C-loop to a normal second portion of  the duodenum.  The scope was withdrawn back to the bulb, and a good look  there ruled out ulcers in that location.  The scope was withdrawn back to  the stomach, and retroflexed.  Cardia, fundus, angularis, lesser and greater  curve were all normal on retroflex visualization except for a minimal amount  of gastritis.  Straight visualization of the stomach confirmed the  gastritis, ruled out any additional findings.  Air was suctioned and the  scope slowly withdrawn.  Again, the esophagus was normal except for some  very minimal distal esophagitis.  On withdrawal, a fairly decent look for [place]  at the posterior  pharynx.  Did not reveal any findings.  Good look at the  cords were seen, although with our larger scope it was hard to maintain  adequate position, and patient did not tolerate looking back in the  posterior pharynx very long.  But again, no obvious abnormality was seen.  The scope was removed.  The patient tolerated the procedure well.  There was  no obvious immediate complication.   ENDOSCOPIC DIAGNOSES:  1.  Tiny hiatal hernia with very minimal distal esophagitis.  2.  Minimal gastritis and antritis.  3.  Otherwise, normal EGD with an okay look at the posterior pharynx.   PLAN:  Further work-up and plans per Dr. Haroldine Laws.  He may need a better  look from his standpoint for following her symptoms, maybe even repeat MRI  to make sure things are not changing.  I  would be happy to see back p.r.n.  In the meantime, can increase his Prilosec to twice a day and possibly even  upgrade him to prescription pump inhibitors once or twice a day as seen fit  by Dr. Haroldine Laws.                                               Petra Kuba, M.D.    MEM/MEDQ  D:  04/16/2004  T:  04/16/2004  Job:  [id number]   cc:   Hermelinda Medicus, M.D.  [address]  [place] [address]  Fax: [phone]   Vale Haven. Andrey Campanile, M.D.  [address]  [place]  [place] [address]  Fax: [phone]

## 2010-12-24 NOTE — H&P (Signed)
NAME:  Pedro [name], CHUBA                            ACCOUNT NO.:  [id number]   MEDICAL RECORD NO.:  [id number]                   PATIENT TYPE:  INP   LOCATION:  5703                                 FACILITY:  [facility]   PHYSICIAN:  Petra Kuba, M.D.                 DATE OF BIRTH:  1946-02-27   DATE OF ADMISSION:  08/19/2002  DATE OF DISCHARGE:                                HISTORY & PHYSICAL   HISTORY OF PRESENT ILLNESS:  The patient is well-known for me for admission  last Monday for diverticular bleeding which has stopped spontaneously.  He  was able to be discharged on Tuesday.  He had done well all week without any  complications or problems, although he did have a headache on Thursday and  went back on his blood pressure medicine.  He had not been on any aspirin or  nonsteroidals but today, began having bright red blood per rectum, got weak  and dizzy and called my office.  He was orthostatic in my office.  Hemoglobin had dropped from 10 to 9 and we admitted him to the hospital.  On  arrival to the hospital his hemoglobin actually had dropped to 8.1 but he  was feeling better and has had no further bowel movements.  All his blood  was bright red, although after a while, he did start passing some clots.  He  does have a hiatal hernia but it really has not been bothering him.  He has  not been taking his pump inhibitors.  He has not been on any aspirin or  nonsteroidals as mentioned above or had any nuts or popcorn.   PAST MEDICAL HISTORY:  This is well outlined on his last H&P.   FAMILY HISTORY:  This is well outlined on his last H&P.   ALLERGIES:  BEE STINGS only.   CURRENT MEDICATIONS:  Norvasc, Seroquel, Ritalin, and BuSpar.   SOCIAL HISTORY:  He dips snuff, does not smoke or drink and does not take  any aspirin or nonsteroidals, as mentioned above.   REVIEW OF SYSTEMS:  This is pertinent for him feeling better currently.  No  other complaints.   PHYSICAL EXAMINATION:   VITAL SIGNS:  Vital signs stable.  Afebrile.  He was  orthostatic earlier in my office.  LUNGS:  Clear.  CARDIOVASCULAR:  Regular rate and rhythm.  ABDOMEN:  Soft and nontender with good bowel sounds.   ASSESSMENT:  Recurrent diverticular bleeding.   PLAN:  We will observe for signs of rebreathing, keep him on clear liquids.  Follow H&H.  Go ahead and give him two units of blood.  Consider colonoscopy  or flexible sigmoidoscopy tomorrow to try to delineate left versus right  diverticular bleeding; however, based on having ticks on both sides,  probably would need a subtotal colectomy.  Could consider nuclear bleeding  scan.  I have asked him  to call me if signs of rebleeding.  Discussed all  the above with he and his wife who understand and agree.                                               Petra Kuba, M.D.    MEM/MEDQ  D:  08/19/2002  T:  08/19/2002  Job:  [id number]

## 2010-12-24 NOTE — Op Note (Signed)
NAME:  Pedro [name], Pedro [name]                            ACCOUNT NO.:  [id number]   MEDICAL RECORD NO.:  [id number]                   PATIENT TYPE:  INP   LOCATION:  5703                                 FACILITY:  [facility]   PHYSICIAN:  Petra Kuba, M.D.                 DATE OF BIRTH:  Dec 22, 1945   DATE OF PROCEDURE:  08/20/2002  DATE OF DISCHARGE:                                 OPERATIVE REPORT   PROCEDURE PERFORMED:  Colonoscopy.   ENDOSCOPIST:  Petra Kuba, M.D.   INDICATIONS FOR PROCEDURE:  GI bleeding, probably diverticula, want to  confirm.  Consent was signed after the risks, benefits, methods and options  were thoroughly discussed with the patient multiple times in the past.   MEDICINES USED:  Demerol 100 mg, Versed 10 mg.   DESCRIPTION OF PROCEDURE:  Rectal inspection was pertinent for external  hemorrhoids.  Digital exam was negative.  A video colonoscope was inserted  and easily advanced around the colon to the cecum.  This did not require any  abdominal pressure or any position changes.  Other than right and left  diverticula, a few clots on the left side, some of which are in diverticula.  No signs of active bleeding or other abnormalities were seen.  The cecum was  identified by the appendiceal orifice and the ileocecal valve.  In fact, the  scope was inserted a short ways in the terminal ileum  which was normal.  No  blood was seen coming from above.  The scope was slowly withdrawn.  The prep  was surprisingly adequate with the exception of the splenic flexure and  parts of the descending.  A good look was had.  On slow withdrawal through  the colon other than left and right diverticula, no other abnormalities were  seen as we slowly withdrew around the colon.  Again, a few clots seemed to  be on the left side and then the left-sided tics only.  In the distal  sigmoid and rectum, two tiny hyperplastic appearing polyps were seen, but  based on this bleeding, we elected not  to biopsy them at this time.  The  scope was retroflexed back in the rectum, pertinent for some internal  hemorrhoids.  Scope was straightened and readvanced a short ways up the left  side of the colon.  Air was suctioned, scope removed.  The patient tolerated  the procedure well without obvious complication.   ENDOSCOPIC DIAGNOSIS:  1. Internal and external hemorrhoids.  2. Left and right diverticula, moderately severe.  3. Questionable few left-sided tics with some clots.  4. Tiny hyperplastic appearing distal sigmoid and rectal polyps not     biopsied.  5. Otherwise within normal limits to the terminal ileum without any blood in     the terminal ileum or any active bleeding throughout.   PLAN:  Okay to go home.  Slowly advance diet.  No aspirin or nonsteroidals.  Follow up with me p.r.n. or in one month.  Recheck CBC, symptoms.  Otherwise, no further work-up or plans needed.                                                Petra Kuba, M.D.    MEM/MEDQ  D:  08/20/2002  T:  08/20/2002  Job:  [id number]

## 2010-12-24 NOTE — H&P (Signed)
NAME:  Pedro [name], Pedro [name]                            ACCOUNT NO.:  [id number]   MEDICAL RECORD NO.:  [id number]                   PATIENT TYPE:  EMS   LOCATION:  MINO                                 FACILITY:  [facility]   PHYSICIAN:  Petra Kuba, M.D.                 DATE OF BIRTH:  [birth year]   DATE OF ADMISSION:  08/12/2002  DATE OF DISCHARGE:                                HISTORY & PHYSICAL   HISTORY OF PRESENT ILLNESS:  The patient is seen at the request of Dr.  Andrey Campanile for presumed lower GI bleeding.  He had several bright red bowel  movements with some cramps relieved with bowel movements that did get  slightly darker as the day progressed.  There were a few clots as well.  The  patient has been followed for me for history of diverticular disease as well  as colon polyps and did undergo a repeat colonoscopy this summer which did  show pan diverticulosis, as well as a few small polyps.  He has had no GI  symptoms up until today except for some occasional hiatal hernia symptoms  where he will occasionally some over the counter Prilosec.  He had been on  Prevacid in the past, but his insurance no longer covers it.  He will also  see some bright red blood on the toilet tissue which is blamed on his  hemorrhoids.  This occasionally will bother him, but that has not been an  issue of late.  He denied any other specific problems.  He was weak and  dizzy initially but feels much better now and has not had a bowel movement  in the last three hours.  He initially went to Dr. Andrey Campanile who sent him to  the ER after a quick procto did show some blood coming from above and ruled  out hemorrhoids as the cause.   PAST MEDICAL HISTORY:  1. Pertinent for high blood pressure.  2. Hiatal hernia.  3. Bilateral inguinal hernia.  4. He also had an appendectomy.  5. Possibly a surgery for a cyst versus a diverticula on his colon.  6. He also had a neck fusion and a lower back diskectomy.   FAMILY HISTORY:   Pertinent for possibly a mom with diverticula and a dad  with possibly colon polyps.  No other obvious GI problems in the family.   SOCIAL HISTORY:  Dips snuff, but does not smoke or drink.   MEDICATIONS:  He does not use any aspirin or nonsteroidals.  He takes a rare  Tylenol.   REVIEW OF SYSTEMS:  Negative except as above.   PHYSICAL EXAMINATION:  GENERAL:  Currently no acute distress.  Was  hypotensive when he first came in the ER.  VITAL SIGNS:  Blood pressure 81/61, pulse 108, better now.  HEENT:  Sclerae nonicteric.  NECK:  Supple without obvious adenopathy.  LUNGS:  Clear.  HEART:  Regular rate and rhythm.  ABDOMEN:  Soft, nontender, good bowel sounds.  RECTAL:  Not repeated, since done by Dr. Andrey Campanile with obvious bright red  blood per rectum.  EXTREMITIES:  Good peripheral pulses, no pedal edema.   LABORATORY DATA:  The only lab back at this time is a hemoglobin and CBC  which was normal.  White count 9, hemoglobin 12.4, MCV 98, platelet count  227.   ASSESSMENT:  1. Gastrointestinal bleeding, probably lower, in a patient with pan     diverticular disease.  2. Attention deficit hyperactivity disorder.  3. High blood pressure.   PLAN:  1. Overnight observation.  2. Clear liquids.  3. Follow stools and color.  4. Follow H&H.  He has no problems with transfusion as we discussed.  We     also discussed the case with Dr. Abbey Chatters, who will be on standby to     help the RN.  We have discussed the recurrent nature of diverticular     bleeding with the patient and his wife, but could probably go home if no     further bleeding and no other issues tomorrow.                                                  Petra Kuba, M.D.    MEM/MEDQ  D:  08/12/2002  T:  08/12/2002  Job:  [id number]   cc:   Petra Kuba, M.D.  [address], [address]  [place]  [place] [address]  Fax: [phone]   Adolph Pollack, M.D.  [address]  [place]  [place]  [address]  Fax: [phone]   [place]. Andrey Campanile, M.D.  [address]  [place]  [place] [address]  Fax: [phone]

## 2010-12-24 NOTE — Consult Note (Signed)
NAME:  Pedro [name], Pedro [name]                            ACCOUNT NO.:  [id number]   MEDICAL RECORD NO.:  [id number]                   PATIENT TYPE:  EMS   LOCATION:  MINO                                 FACILITY:  [facility]   PHYSICIAN:  Adolph Pollack, M.D.            DATE OF BIRTH:  1946-03-24   DATE OF CONSULTATION:  08/12/2002  DATE OF DISCHARGE:                                   CONSULTATION   REASON FOR CONSULTATION:  Rectal bleeding.   HISTORY OF PRESENT ILLNESS:  The patient is a 65 year old male who had some  crampy abdominal pain today at work and felt like he was going to have  diarrhea and he went to the bathroom and noticed bright red blood in the  commode.  He then went home and started feeling a little dizzy and had two  other episodes of blood per rectum, the last one being dark clots.  Dr.  Duffy Rhody C. Andrey Campanile saw him in the office and noted him to be normotensive but  a little bit tachycardic.  He performed an anoscopy exam in the office and  felt that the blood was coming from above the internal hemorrhoids, which  the patient has been known to have.  He sent him to the emergency department  and had asked me to see him; he had also called Dr. Petra Kuba as well.   PAST MEDICAL HISTORY:  1. Acute diverticulitis.  2. Diverticulosis -- he has had a previous colonoscopy by Dr. Ewing Schlein which     has shown right-sided and left-sided diverticular disease.  3. Attention deficit disorder.  4. Hypertension.   PAST SURGICAL HISTORY:  Previous abdominal operations:  Bilateral inguinal  hernia repairs; exploratory laparotomy and appendectomy (this was done 27  years ago and at this time, diverticulitis was actually diagnosed).   ALLERGIES:  None known.   MEDICATIONS:  Medications include Ritalin, BuSpar, Seroquel at night for  sleep, Diovan.   SOCIAL HISTORY:  He is here with his wife and married and is employed.   REVIEW OF SYSTEMS:  Generally, he said he felt weak and dizzy  today.  GI:  He states he has had some bright red blood per rectum before and there was a  question of whether this was from his internal hemorrhoid or from his  diverticular disease in the past.  He is no longer having the cramping  abdominal pain now.  He also states he has a hiatal hernia.   PHYSICAL EXAMINATION:  GENERAL:  Generally, a slightly pale male in no acute  distress, pleasant and cooperative.  VITAL SIGNS:  Temperature is 97.8, blood pressure is 81/61, heart rate is  108.  ABDOMEN:  His abdomen is soft and nontender.  There is a right lower  quadrant scar present and two groin scars present.  No palpable masses.  Good bowel sounds heard.  He has got  a small umbilical fascial defect.   LABORATORY DATA:  CBC demonstrates a hemoglobin of 12.4, white cell count  9000, platelet count 227,000.  PT and PTT pending.   IMPRESSION:  Lower gastrointestinal bleed -- most likely source would be  diverticular.  There seems to be a little bit more blood than would be  expected from hemorrhoidal disease.  Other potential issues could be an  arteriovenous malformation or upper gastrointestinal source, although he  does not have any upper gastrointestinal symptoms.   RECOMMENDATIONS:  I talked with Dr. Ewing Schlein, who is going to admit him for  observation; I agree with this.  If these bouts were to become recurrent or  if the bleeding were to continue, then he may need a total abdominal  colectomy and I have discussed this with him and his wife.  I will be  available if needed further.                                               Adolph Pollack, M.D.    Kari Baars  D:  08/12/2002  T:  08/13/2002  Job:  [id number]   cc:   [place]. Andrey Campanile, M.D.  [address]  [place]  [place] [address]  Fax: [phone]   Petra Kuba, M.D.  [address], [address]  [place]  [place] [address]  Fax: [phone]

## 2011-01-07 ENCOUNTER — Ambulatory Visit: Payer: Self-pay | Admitting: Oncology

## 2011-01-24 ENCOUNTER — Ambulatory Visit: Payer: Self-pay | Admitting: Urology

## 2011-02-06 ENCOUNTER — Ambulatory Visit: Payer: Self-pay | Admitting: Oncology

## 2011-07-27 IMAGING — CT CT ABDOMEN AND PELVIS WITHOUT AND WITH CONTRAST
2 of 4 series · 13 of 32 positions shown, 18 images · non-contrast
Comparison: none

REASON FOR EXAM: DYSPLASTIC AND CYSTIC RIGHT KIDNEY
COMMENTS:

PROCEDURE:     CT  - CT ABDOMEN / PELVIS  W/WO  - March 18, 2009  [DATE]
RESULT:     Triphasic CT of the abdomen and pelvis is performed in the
standard fashion.
There is no previous examination at this institution for comparison.

[Series 3: with · axial · 0.79mm/px · z∈[+142,+492]mm · 8 of 92 slices shown, 13 images]
[im 11/92  soft-tissue]
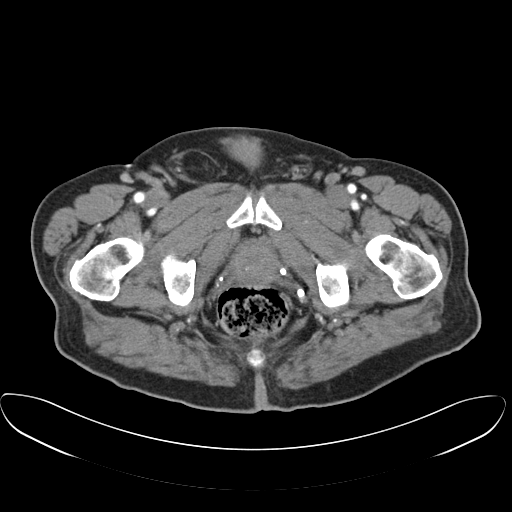
[im 11/92  bone]
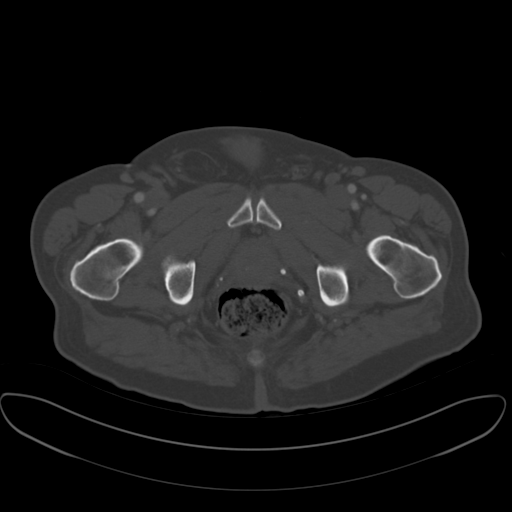
[im 21/92  soft-tissue]
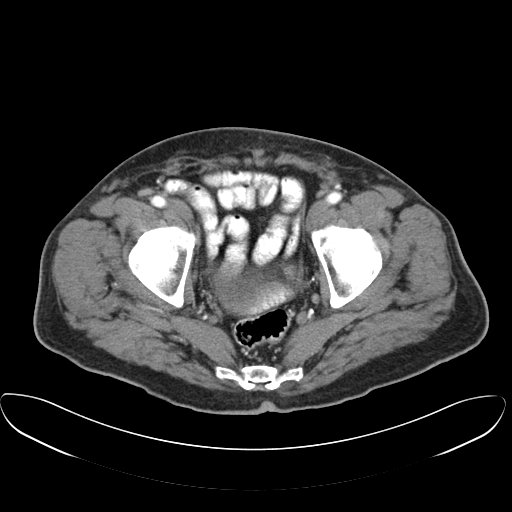
[im 31/92  soft-tissue]
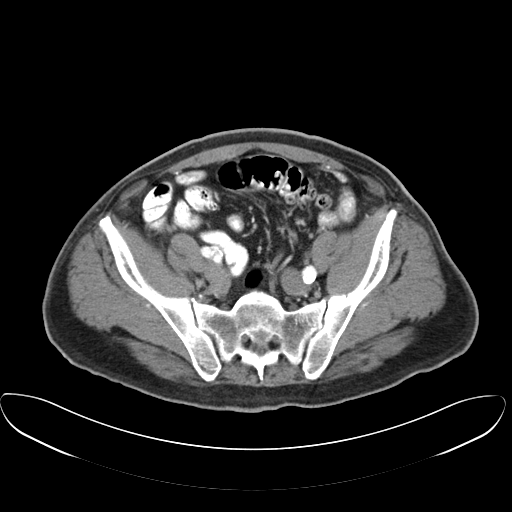
[im 41/92  soft-tissue]
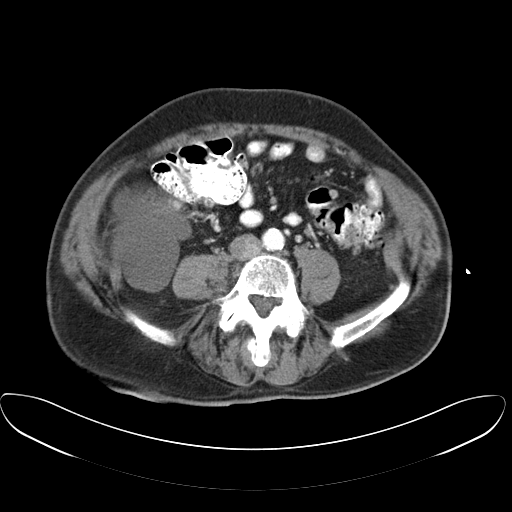
[im 51/92  soft-tissue]
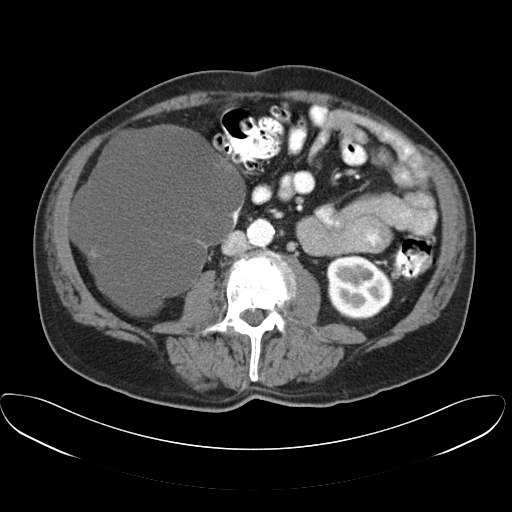
[im 51/92  lung]
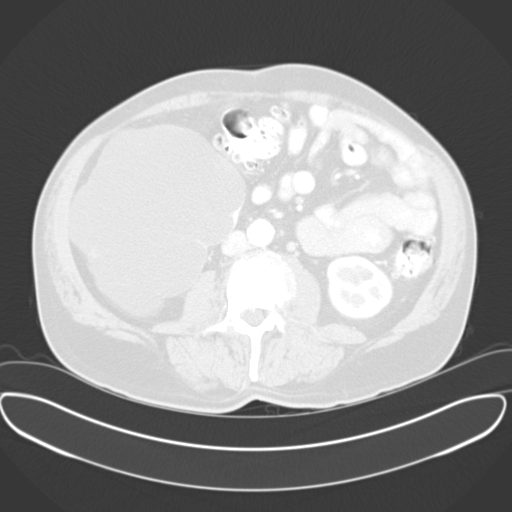
[im 61/92  soft-tissue]
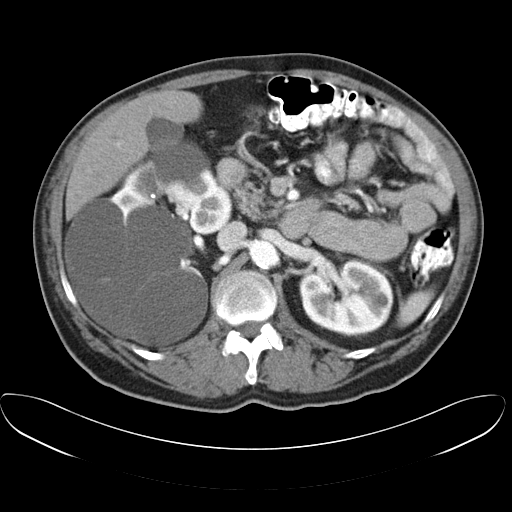
[im 61/92  lung]
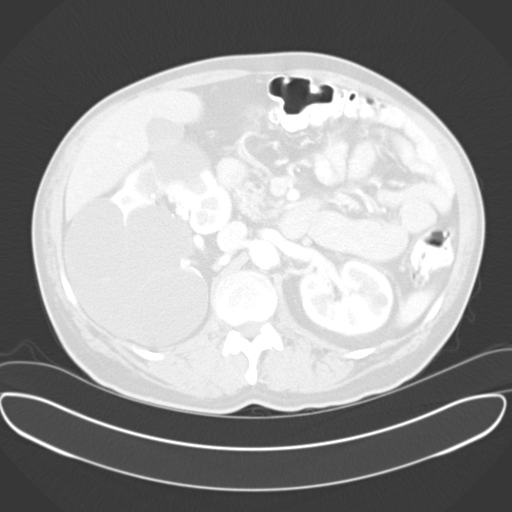
[im 71/92  soft-tissue]
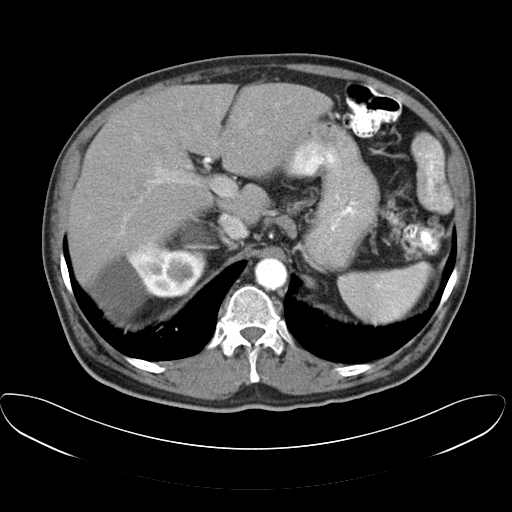
[im 71/92  lung]
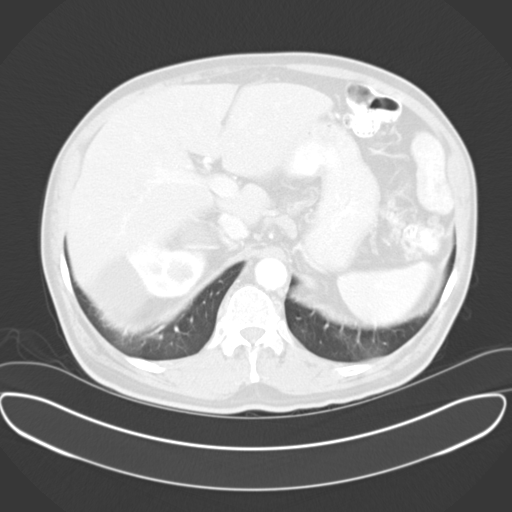
[im 81/92  soft-tissue]
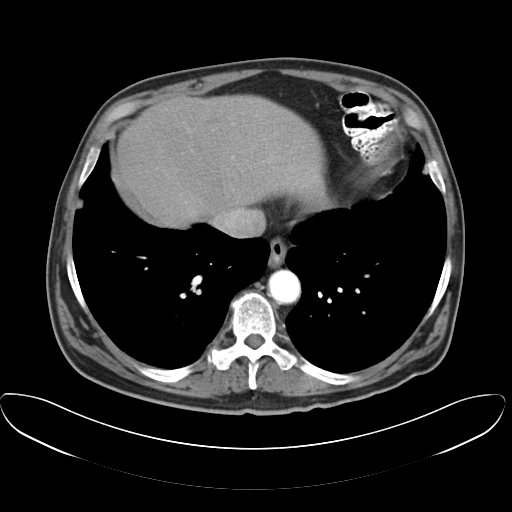
[im 81/92  lung]
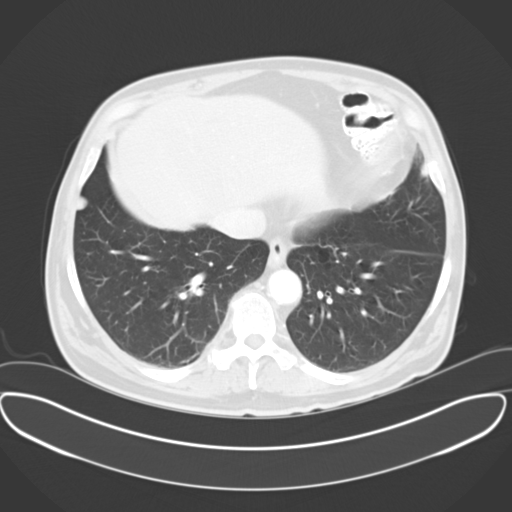

[Series 4: delay · axial · delayed · 0.79mm/px · z∈[+142,+342]mm · 5 of 92 slices shown]
[im 11/92  soft-tissue]
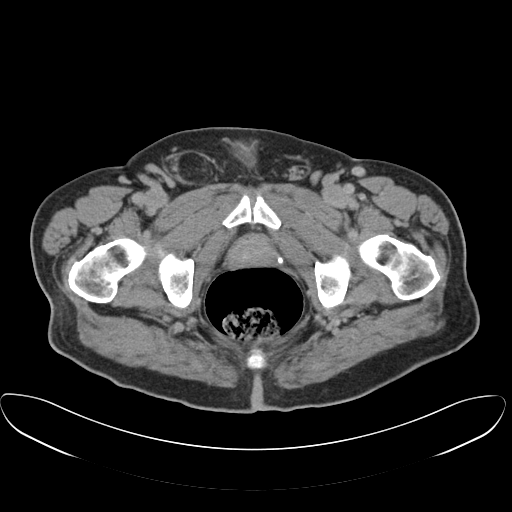
[im 21/92  soft-tissue]
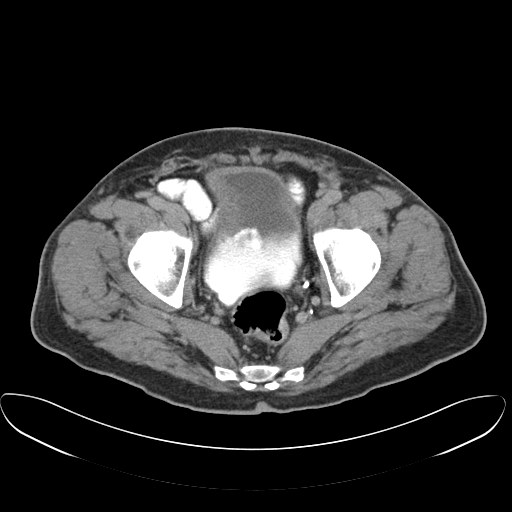
[im 31/92  soft-tissue]
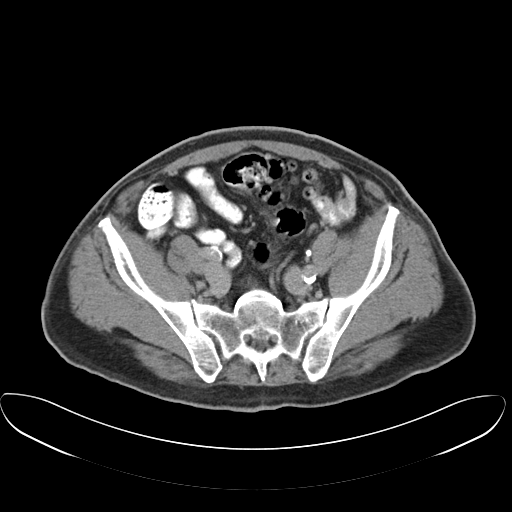
[im 41/92  soft-tissue]
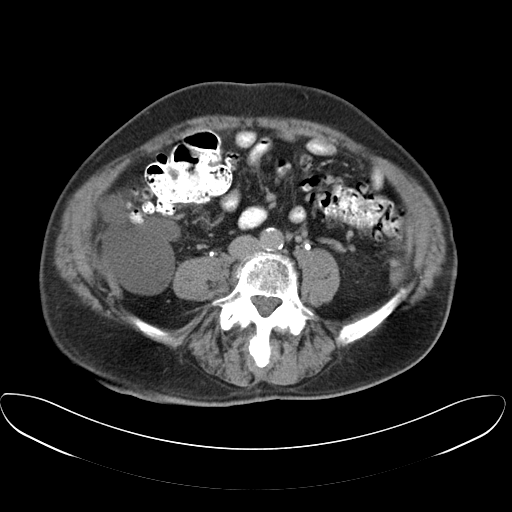
[im 51/92  soft-tissue]
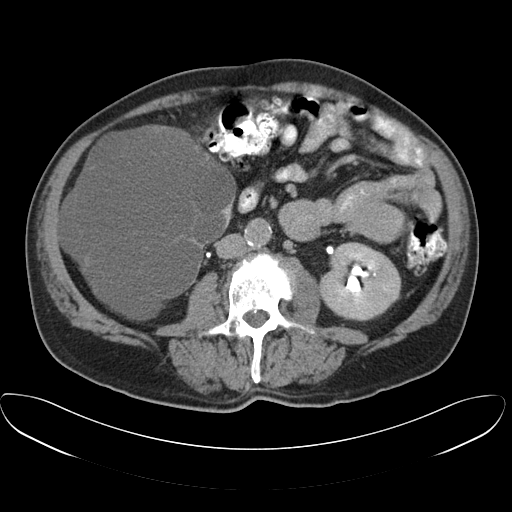

[13 of 32 positions shown; findings below may reference images not displayed]

FINDINGS: There is a known history of a multicystic,dysplastic right kidney.
The precontrast images demonstrate punctate calcifications within the right
kidney with multiple cysts and septations present. Smaller punctate calcific
densities are noted in the upper pole collecting system of the left kidney
consistent with small stones. There may be a mid pole left renal stone on
image #31. These are relatively small being in the 1 to 2 mm size range.

Following contrast administration, there is abnormal enhancement in the
anterior aspect of the upper portion of the right kidney measuring
approximately 4.06 x 2.91 cm on image #26. This area continues to show an
abnormal attenuation and enhancement pattern on delayed images and is
concerning for underlying renal cell carcinoma. Urologic consultation is
recommended. There are small, low attenuation areas in the left kidney
suggestive of small cysts. The largest appears to be approximately 12.3 mm
in size in the mid to lower portion of the left kidney laterally. No other
solid masses are seen. There is some preserved function in the remaining
renal parenchyma on the right kidney. The kidney is predominantly cystic
otherwise.

The liver and gallbladder appear to be unremarkable. The spleen is within
normal limits. The pancreas appears normal. The aorta shows some mild
atherosclerotic calcification with no aneurysm. There is no ascites. There
is a fat filled, right inguinal hernia. The bowel is nondistended and shows
no wall thickening. There is no acute inflammation evident. The adrenal
glands are unremarkable. Images through the base of the lungs demonstrate
mild emphysematous disease. There is a pleural based, nodular density in the
right lower lobe, demonstrated on image #12, measuring approximately 5.9 mm
in diameter.

Degenerative changes are noted in the spine especially at the L2-3 and L3-4
level with vacuum disc changes and disc space narrowing seen at L5-S1.
IMPRESSION: 1.  Multicystic, dysplastic kidney with possible underlying renal cell
carcinoma in the right kidney. Urologic consultation is recommended.
2.  Atherosclerotic disease.
3.  Pleural based nodule in the right lower lobe laterally which is
nonspecific. No other pulmonary nodules are evident. Follow-up chest CT may
be beneficial.
4.  No definite solid lesion in the left kidney. Multiple small cysts are
present.

## 2011-08-08 ENCOUNTER — Ambulatory Visit: Payer: Self-pay | Admitting: Oncology

## 2011-08-12 ENCOUNTER — Ambulatory Visit: Payer: Self-pay | Admitting: Oncology

## 2011-08-12 LAB — CBC CANCER CENTER
Basophil %: 0.6 %
Eosinophil #: 0.4 x10 3/mm (ref 0.0–0.7)
Eosinophil %: 4.4 %
HCT: 42.9 % (ref 40.0–52.0)
Lymphocyte #: 1.9 x10 3/mm (ref 1.0–3.6)
Lymphocyte %: 23.7 %
MCHC: 34.2 g/dL (ref 32.0–36.0)
MCV: 100 fL (ref 80–100)
Neutrophil %: 64.2 %
Platelet: 196 x10 3/mm (ref 150–440)
WBC: 8 x10 3/mm (ref 3.8–10.6)

## 2011-08-12 LAB — COMPREHENSIVE METABOLIC PANEL
Albumin: 3.8 g/dL (ref 3.4–5.0)
Alkaline Phosphatase: 104 U/L (ref 50–136)
Anion Gap: 4 — ABNORMAL LOW (ref 7–16)
BUN: 13 mg/dL (ref 7–18)
Calcium, Total: 8.8 mg/dL (ref 8.5–10.1)
Co2: 32 mmol/L (ref 21–32)
Creatinine: 1.13 mg/dL (ref 0.60–1.30)
EGFR (Non-African Amer.): >60
Glucose: 113 mg/dL — ABNORMAL HIGH (ref 65–99)
SGOT(AST): 18 U/L (ref 15–37)
SGPT (ALT): 28 U/L
Sodium: 141 mmol/L (ref 136–145)
Total Protein: 7.1 g/dL (ref 6.4–8.2)

## 2011-09-09 ENCOUNTER — Ambulatory Visit: Payer: Self-pay | Admitting: Oncology

## 2011-10-19 ENCOUNTER — Other Ambulatory Visit: Payer: Self-pay | Admitting: Family Medicine

## 2011-11-29 ENCOUNTER — Other Ambulatory Visit: Payer: Self-pay | Admitting: Orthopedic Surgery

## 2011-11-29 DIAGNOSIS — M545 Low back pain: Secondary | ICD-10-CM

## 2011-12-03 ENCOUNTER — Ambulatory Visit
Admission: RE | Admit: 2011-12-03 | Discharge: 2011-12-03 | Disposition: A | Discharge status: Home / Self Care | Payer: PRIVATE HEALTH INSURANCE | Source: Ambulatory Visit | Attending: Orthopedic Surgery | Admitting: Orthopedic Surgery

## 2011-12-03 DIAGNOSIS — M545 Low back pain: Secondary | ICD-10-CM

## 2011-12-20 ENCOUNTER — Other Ambulatory Visit: Payer: Self-pay | Admitting: *Deleted

## 2011-12-20 ENCOUNTER — Other Ambulatory Visit: Payer: Self-pay

## 2011-12-20 MED ORDER — AMLODIPINE BESYLATE 5 MG PO TABS: 5.0000 mg | ORAL_TABLET | Freq: Every day | ORAL | Status: DC

## 2011-12-20 MED ORDER — VALSARTAN 160 MG PO TABS: 160.0000 mg | ORAL_TABLET | Freq: Every day | ORAL | Status: DC

## 2011-12-20 NOTE — Telephone Encounter (Signed)
Pt scheduled appt for 01/06/12 at 12 noon with Dr Ermalene Searing. Amlodipine 5 mg # 30 x 0 and valsartan 160 mg #30 x 0 sent to [facility]. Pt notified while on phone.

## 2011-12-20 NOTE — Telephone Encounter (Signed)
Heather call pt... No refills until appt made.

## 2011-12-20 NOTE — Telephone Encounter (Signed)
Patient has not been seen since 07/2010.  She was given a notice to schedule an appt on her last refill.  No appt is scheduled.  Please advise.

## 2011-12-20 NOTE — Telephone Encounter (Signed)
Unable to contact patient but, left voice mail advising patient to schedule appt then medication would be refilled rx refused until patient schedules appt

## 2012-01-05 ENCOUNTER — Encounter: Payer: Self-pay | Admitting: Family Medicine

## 2012-01-06 ENCOUNTER — Encounter: Payer: Self-pay | Admitting: Family Medicine

## 2012-01-06 ENCOUNTER — Ambulatory Visit (INDEPENDENT_AMBULATORY_CARE_PROVIDER_SITE_OTHER): Payer: PRIVATE HEALTH INSURANCE | Admitting: Family Medicine

## 2012-01-06 VITALS — BP 120/60 | HR 128 | Temp 100.4°F | Wt 158.0 lb

## 2012-01-06 DIAGNOSIS — E7800 Pure hypercholesterolemia, unspecified: Secondary | ICD-10-CM

## 2012-01-06 DIAGNOSIS — I10 Essential (primary) hypertension: Secondary | ICD-10-CM

## 2012-01-06 DIAGNOSIS — R509 Fever, unspecified: Secondary | ICD-10-CM | POA: Insufficient documentation

## 2012-01-06 DIAGNOSIS — N390 Urinary tract infection, site not specified: Secondary | ICD-10-CM

## 2012-01-06 LAB — POCT URINALYSIS DIPSTICK
Nitrite, UA: POSITIVE
Urobilinogen, UA: NEGATIVE
pH, UA: 6

## 2012-01-06 MED ORDER — CIPROFLOXACIN HCL 500 MG PO TABS: 500.0000 mg | ORAL_TABLET | Freq: Two times a day (BID) | ORAL | Status: AC

## 2012-01-06 MED ORDER — ACETAMINOPHEN 500 MG PO TABS
1000.0000 mg | ORAL_TABLET | Freq: Once | ORAL | Status: AC
  Administered 2012-01-06: 1000 mg via ORAL

## 2012-01-06 NOTE — Assessment & Plan Note (Signed)
Well controlled. Continue current medication.  

## 2012-01-06 NOTE — Patient Instructions (Addendum)
Start antibitoic for fever from presumed UTI /prostate infection.  We will call with culture results.  Call if continued fever on antibiotics or if not feeloing better or new symptoms. Return for fasting labs and CPX, work in sometime in 1-2 weeks please.Marland Kitchen

## 2012-01-06 NOTE — Progress Notes (Signed)
  Subjective:    Patient ID: Pedro [name], male    DOB: Dec 13, 1945, 66 y.o.   MRN: [id number]  HPI  The patient is here for annual wellness exam and preventative care but we will postpone this given pt with acute onset fever of unknown origin.  He is having chills and fever today, body aches, just started today (temp 100.4). No cough, no congestion, no ear pain, no ST. No SOB, No chest pain. No rash. No dysuria. No abdominal pain. Mild pain in anterior left hip  2-3 weeks ago pulled off some ticks, has had some mosquito bites.   Low back pain.. Last steroid injection happening 3 weeks ago.  Dr Alvester Morin and Dr. Christian Mate.  On tramadol for pain.  Hypertension:  Well controlled on amlodipine and diovan  Using medication without problems or lightheadedness:  Chest pain with exertion:None Edema:None Short of breath:None Average home BPs: not controlled Other issues:  Elevated Cholesterol: Due for re-val. Lab Results  Component Value Date   CHOL 156 09/28/2010   HDL 41.00 09/28/2010   LDLCALC 104* 09/28/2010   TRIG 57.0 09/28/2010   CHOLHDL 4 09/28/2010   COPD, moderate : on no medication. Continues smoking. pre contemplative.    Review of Systems  Constitutional: Positive for fever. Negative for fatigue.  HENT: Negative for ear pain.   Eyes: Negative for pain.  Respiratory: Negative for cough and shortness of breath.   Cardiovascular: Negative for chest pain and leg swelling.  Gastrointestinal: Negative for nausea, vomiting, abdominal pain, diarrhea and blood in stool.  Genitourinary: Negative for dysuria, frequency and hematuria.  Musculoskeletal: Positive for back pain and joint swelling.  Neurological: Negative for tremors, light-headedness and numbness.       Towards end of appt pt feeling dizzy, one sharp pain in head when shaking head Fever higher       Objective:   Physical Exam  Constitutional: He is oriented to person, place, and time. He appears well-developed and  well-nourished.       In blanket, chilled, febrile  HENT:  Right Ear: External ear normal.  Left Ear: External ear normal.  Mouth/Throat: Oropharynx is clear and moist. No oropharyngeal exudate.  Eyes: Conjunctivae and EOM are normal. Pupils are equal, round, and reactive to light. Right eye exhibits no discharge. Left eye exhibits no discharge. No scleral icterus.  Neck: Normal range of motion. Neck supple. No tracheal deviation present. No thyromegaly present.  Cardiovascular: Normal rate, regular rhythm, normal heart sounds and intact distal pulses.  Exam reveals no gallop and no friction rub.   No murmur heard. Pulmonary/Chest: Effort normal and breath sounds normal. He has no wheezes. He has no rales. He exhibits no tenderness.  Abdominal: Soft. Bowel sounds are normal. He exhibits no distension and no mass. There is no tenderness. There is no rebound and no guarding.  Musculoskeletal:       Low back ttp  Lymphadenopathy:    He has no cervical adenopathy.  Neurological: He is alert and oriented to person, place, and time.  Skin: Skin is warm and dry. No rash noted.       Several slightly erythematous healing bites, no pustules          Assessment & Plan:

## 2012-01-06 NOTE — Assessment & Plan Note (Signed)
Very nonspecific symptoms... UA showed blood and white blood cells, nit positive.  Likely prostate nfeciton.  Start cipro for 14 days.  Send urine for culture.  Push fluids, if not tolerating po call.  If not improving with antibiotics or continued fever consider further eval.

## 2012-01-06 NOTE — Assessment & Plan Note (Signed)
Due for re-eval. 

## 2012-01-06 NOTE — Progress Notes (Signed)
Addended by: Alvina Chou on: 01/06/2012 02:16 PM   Modules accepted: Orders

## 2012-01-06 NOTE — Progress Notes (Signed)
Addended by: Patience Musca on: 01/06/2012 02:04 PM   Modules accepted: Orders

## 2012-01-09 LAB — URINE CULTURE: Colony Count: >=100000

## 2012-01-18 ENCOUNTER — Other Ambulatory Visit: Payer: Self-pay | Admitting: Family Medicine

## 2012-01-30 ENCOUNTER — Other Ambulatory Visit (INDEPENDENT_AMBULATORY_CARE_PROVIDER_SITE_OTHER): Payer: PRIVATE HEALTH INSURANCE

## 2012-01-30 DIAGNOSIS — E7800 Pure hypercholesterolemia, unspecified: Secondary | ICD-10-CM

## 2012-01-30 LAB — COMPREHENSIVE METABOLIC PANEL
ALT: 26 U/L (ref 0–53)
AST: 25 U/L (ref 0–37)
Albumin: 3.6 g/dL (ref 3.5–5.2)
Alkaline Phosphatase: 56 U/L (ref 39–117)
Calcium: 8.7 mg/dL (ref 8.4–10.5)
Chloride: 104 mEq/L (ref 96–112)
Potassium: 4.6 mEq/L (ref 3.5–5.1)
Sodium: 139 mEq/L (ref 135–145)
Total Protein: 6.3 g/dL (ref 6.0–8.3)

## 2012-01-30 LAB — LIPID PANEL
LDL Cholesterol: 72 mg/dL (ref 0–99)
Total CHOL/HDL Ratio: 3
Triglycerides: 67 mg/dL (ref 0.0–149.0)

## 2012-02-03 ENCOUNTER — Ambulatory Visit (INDEPENDENT_AMBULATORY_CARE_PROVIDER_SITE_OTHER): Payer: PRIVATE HEALTH INSURANCE | Admitting: Family Medicine

## 2012-02-03 ENCOUNTER — Encounter: Payer: Self-pay | Admitting: Family Medicine

## 2012-02-03 VITALS — BP 110/60 | HR 116 | Temp 98.5°F | Ht 68.0 in | Wt 153.5 lb

## 2012-02-03 DIAGNOSIS — J4489 Other specified chronic obstructive pulmonary disease: Secondary | ICD-10-CM

## 2012-02-03 DIAGNOSIS — Z0000 Encounter for general adult medical examination without abnormal findings: Secondary | ICD-10-CM

## 2012-02-03 DIAGNOSIS — E7800 Pure hypercholesterolemia, unspecified: Secondary | ICD-10-CM

## 2012-02-03 DIAGNOSIS — F17200 Nicotine dependence, unspecified, uncomplicated: Secondary | ICD-10-CM

## 2012-02-03 DIAGNOSIS — R972 Elevated prostate specific antigen [PSA]: Secondary | ICD-10-CM

## 2012-02-03 DIAGNOSIS — C649 Malignant neoplasm of unspecified kidney, except renal pelvis: Secondary | ICD-10-CM

## 2012-02-03 DIAGNOSIS — K4090 Unilateral inguinal hernia, without obstruction or gangrene, not specified as recurrent: Secondary | ICD-10-CM | POA: Insufficient documentation

## 2012-02-03 DIAGNOSIS — I10 Essential (primary) hypertension: Secondary | ICD-10-CM

## 2012-02-03 DIAGNOSIS — J449 Chronic obstructive pulmonary disease, unspecified: Secondary | ICD-10-CM

## 2012-02-03 NOTE — Assessment & Plan Note (Signed)
Well controlled on no medication. Encouraged exercise, weight loss, healthy eating habits.  

## 2012-02-03 NOTE — Assessment & Plan Note (Signed)
Sees  Dr. Lonna Cobb but he is not following prostate. Nml exam, but bump in PSa.Hennie Duos with PSA free and total.

## 2012-02-03 NOTE — Progress Notes (Signed)
Subjective:    Patient ID: Pedro [name], male    DOB: Nov 09, 1945, 66 y.o.   MRN: [id number]  HPI The patient is here for annual wellness exam and preventative care.    Recent Ecoli UTI treated with cipro. Resolved.  Low back pain, radiculopathy: Seeing Dr. Madelon Lips and Alvester Morin. 2 spinal injections, did not help much. Plans to follow up.  Will like go back to Dr. Dutch Quint.  Hypertension:  Well controlled on amlodipine  Using medication without problems or lightheadedness: None Chest pain with exertion:None Edema:None Short of breath:mild Average home BPs: not checking. Other issues:  Elevated Cholesterol: Well controlled on no medication. Lab Results  Component Value Date   CHOL 134 01/30/2012   HDL 48.90 01/30/2012   LDLCALC 72 01/30/2012   TRIG 67.0 01/30/2012   CHOLHDL 3 01/30/2012  Diet compliance: Moderate, skips meals, eat at night. Exercise:None, house work. Other complaints:  COPD, moderate Continues smoking. Moderate COPD, daily cough. Pre-contemplative smoking cessation.  Was not interested in spiriva in past. Last PFTs 2012.   Review of Systems  Constitutional: Negative for fever, fatigue and unexpected weight change.  HENT: Negative for ear pain, congestion, sore throat, rhinorrhea, trouble swallowing and postnasal drip.   Eyes: Negative for pain.  Respiratory: Negative for cough, shortness of breath and wheezing.   Cardiovascular: Negative for chest pain, palpitations and leg swelling.  Gastrointestinal: Positive for constipation. Negative for nausea, abdominal pain, diarrhea and blood in stool.  Genitourinary: Negative for dysuria, urgency, hematuria, discharge, penile swelling, scrotal swelling, difficulty urinating, penile pain and testicular pain.  Skin: Negative for rash.  Neurological: Negative for syncope, weakness, light-headedness, numbness and headaches.  Psychiatric/Behavioral: Negative for behavioral problems and dysphoric mood. The patient is not  nervous/anxious.        Objective:   Physical Exam  Constitutional: He appears well-developed and well-nourished.  Non-toxic appearance. He does not appear ill. No distress.  HENT:  Head: Normocephalic and atraumatic.  Right Ear: Hearing, tympanic membrane, external ear and ear canal normal.  Left Ear: Hearing, tympanic membrane, external ear and ear canal normal.  Nose: Nose normal.  Mouth/Throat: Uvula is midline, oropharynx is clear and moist and mucous membranes are normal.  Eyes: Conjunctivae, EOM and lids are normal. Pupils are equal, round, and reactive to light. No foreign bodies found.  Neck: Trachea normal, normal range of motion and phonation normal. Neck supple. Carotid bruit is not present. No mass and no thyromegaly present.  Cardiovascular: Normal rate, regular rhythm, S1 normal, S2 normal, intact distal pulses and normal pulses.  Exam reveals no gallop.   No murmur heard. Pulmonary/Chest: Breath sounds normal. He has no wheezes. He has no rhonchi. He has no rales.  Abdominal: Soft. Normal appearance and bowel sounds are normal. There is no hepatosplenomegaly. There is no tenderness. There is no rebound, no guarding and no CVA tenderness. A hernia is present. Hernia confirmed positive in the right inguinal area. Hernia confirmed negative in the left inguinal area.  Genitourinary: Prostate normal, testes normal and penis normal. Rectal exam shows no external hemorrhoid, no internal hemorrhoid, no fissure, no mass, no tenderness and anal tone normal. Guaiac negative stool.    Prostate is not enlarged and not tender. Right testis shows no mass and no tenderness. Left testis shows no mass and no tenderness. No paraphimosis or penile tenderness.  Lymphadenopathy:    He has no cervical adenopathy.       Right: No inguinal adenopathy present.  Left: No inguinal adenopathy present.  Neurological: He is alert. He has normal strength and normal reflexes. No cranial nerve deficit  or sensory deficit. Gait normal.  Skin: Skin is warm, dry and intact. No rash noted.  Psychiatric: He has a normal mood and affect. His speech is normal and behavior is normal. Judgment normal.          Assessment & Plan:  The patient's preventative maintenance and recommended screening tests for an annual wellness exam were reviewed in full today. Brought up to date unless services declined.  Counselled on the importance of diet, exercise, and its role in overall health and mortality. The patient's FH and SH was reviewed, including their home life, tobacco status, and drug and alcohol status.   Vaccines:Uptodate with Td, due for zostavax (not interested) and PNA (not interested) Smoker >25 year: due for CXR last done with ONC 08/2011, last PFTs showed moderate COPD: was not interested in spiriva at that time. Colonoscopy Result Date: 12/10/2009  Colonoscopy Result: polyps, hyperplastic  Colonoscopy Next Due: 5 yr (2016)  PSA: prostate exam today  Lab Results  Component Value Date   PSA 1.02 01/30/2012   PSA 0.37 10/04/2010   PSA 0.30 01/29/2009

## 2012-02-03 NOTE — Assessment & Plan Note (Signed)
Hx of Bilateral herniorrhaphy. Pt states hernia is non tender.. Not yet interested in referral to surgeon. He knows warning signs and will let me know.

## 2012-02-03 NOTE — Assessment & Plan Note (Signed)
Well controlled. Continue current medication.  

## 2012-02-03 NOTE — Assessment & Plan Note (Signed)
precontemplative. 

## 2012-02-03 NOTE — Patient Instructions (Addendum)
Miralax as needed for constipation. Increase water and fiber. If you are interested.. Call to consider spiriva to help breathing. Quit smoking! Decrease sugary drinks, work on exercise and healthy eating.  If interested in referral to surgeon for opinion on possible right inguinal hernia.. Please call, Stop at lab on your way out.

## 2012-02-03 NOTE — Assessment & Plan Note (Signed)
Refuses Spiriva at this time.

## 2012-02-05 ENCOUNTER — Telehealth: Payer: Self-pay | Admitting: Family Medicine

## 2012-02-05 DIAGNOSIS — R972 Elevated prostate specific antigen [PSA]: Secondary | ICD-10-CM

## 2012-02-05 NOTE — Telephone Encounter (Signed)
Notify pt that there is a 23 % chance that the bump in PSa is from prostate cancer... This is relatively low given nml retal exam as well. WE will have him return for PSA free and total in 6 months... Please schedule.

## 2012-02-06 NOTE — Telephone Encounter (Signed)
Left message for patient to return my call.

## 2012-02-14 ENCOUNTER — Ambulatory Visit: Payer: Self-pay | Admitting: Urology

## 2012-02-29 ENCOUNTER — Other Ambulatory Visit: Payer: Self-pay | Admitting: Neurosurgery

## 2012-02-29 ENCOUNTER — Encounter (HOSPITAL_COMMUNITY): Payer: Self-pay | Admitting: Pharmacy Technician

## 2012-03-06 ENCOUNTER — Other Ambulatory Visit (HOSPITAL_COMMUNITY): Payer: PRIVATE HEALTH INSURANCE

## 2012-03-08 ENCOUNTER — Encounter (HOSPITAL_COMMUNITY)
Admission: RE | Admit: 2012-03-08 | Discharge: 2012-03-08 | Disposition: A | Discharge status: Home / Self Care | Payer: PRIVATE HEALTH INSURANCE | Source: Ambulatory Visit | Attending: Neurosurgery | Admitting: Neurosurgery

## 2012-03-08 ENCOUNTER — Encounter (HOSPITAL_COMMUNITY): Payer: Self-pay

## 2012-03-08 ENCOUNTER — Other Ambulatory Visit: Payer: Self-pay | Admitting: Neurosurgery

## 2012-03-08 HISTORY — DX: Anxiety disorder, unspecified: F41.9

## 2012-03-08 HISTORY — DX: Pneumonia, unspecified organism: J18.9

## 2012-03-08 HISTORY — DX: Constipation, unspecified: K59.00

## 2012-03-08 LAB — BASIC METABOLIC PANEL
BUN: 25 mg/dL — ABNORMAL HIGH (ref 6–23)
CO2: 28 mEq/L (ref 19–32)
Calcium: 11 mg/dL — ABNORMAL HIGH (ref 8.4–10.5)
GFR calc non Af Amer: 38 mL/min — ABNORMAL LOW (ref >90)
Glucose, Bld: 112 mg/dL — ABNORMAL HIGH (ref 70–99)
Potassium: 5 mEq/L (ref 3.5–5.1)
Sodium: 139 mEq/L (ref 135–145)

## 2012-03-08 LAB — CBC WITH DIFFERENTIAL/PLATELET
Basophils Relative: 0 % (ref 0–1)
Eosinophils Absolute: 0.1 10*3/uL (ref 0.0–0.7)
Eosinophils Relative: 2 % (ref 0–5)
Hemoglobin: 13.8 g/dL (ref 13.0–17.0)
Lymphs Abs: 1.2 10*3/uL (ref 0.7–4.0)
MCH: 34 pg (ref 26.0–34.0)
MCHC: 33.5 g/dL (ref 30.0–36.0)
MCV: 101.5 fL — ABNORMAL HIGH (ref 78.0–100.0)
Monocytes Relative: 7 % (ref 3–12)
RBC: 4.06 MIL/uL — ABNORMAL LOW (ref 4.22–5.81)

## 2012-03-08 NOTE — Progress Notes (Signed)
LOV and labs requested from Dr. Irineo Axon [facility], [place]) Phone [phone]. Fax [phone].

## 2012-03-08 NOTE — Progress Notes (Signed)
Spoke with Erie Noe at Dr. Lindalou Hose office requesting order for consent. She stated she would get that in.

## 2012-03-08 NOTE — Progress Notes (Signed)
Patient informed Nurse that he had a stress test several years ago but was unaware where he had stress test. Patient denied having a cardiac cath, and informed Nurse that he had a sleep study several years ago but denied wearing a CPAP or BiPAP machine.

## 2012-03-08 NOTE — Pre-Procedure Instructions (Signed)
22 Pedro [name]  03/08/2012   Your procedure is scheduled on:  Monday, August, 5, 2013  Report to [facility] at 09:00 AM.  Call this number if you have problems the morning of surgery: [phone]   Remember:   Do not eat food or drink :After Midnight.    Take these medicines the morning of surgery with A SIP OF WATER: Amlodipine (Norvasc), and Tramadol (Ultram) if needed for pain.   Do not wear jewelry.  Do not wear lotions or colognes.  Men may shave face and neck.  Do not bring valuables to the hospital.  Contacts, dentures or bridgework may not be worn into surgery.  Leave suitcase in the car. After surgery it may be brought to your room.  For patients admitted to the hospital, checkout time is 11:00 AM the day of discharge.   Patients discharged the day of surgery will not be allowed to drive home.  Name and phone number of your driver: Family/ Friend   Special Instructions: CHG Shower Use Special Wash: 1/2 bottle night before surgery and 1/2 bottle morning of surgery.   Please read over the following fact sheets that you were given: Pain Booklet, Coughing and Deep Breathing, Blood Transfusion Information and Surgical Site Infection Prevention

## 2012-03-09 NOTE — Consult Note (Signed)
Anesthesia Chart Review:  Patient is a 66 year old male posted for 2 level lumbar laminectomy (orders pending) by Dr. Jordan Likes on 03/12/12.  History includes smoking, ADD, ETOH abuse now sober, depression, COPD, PNA, anxiety hypercholesterolemia, insomnia, renal cell cancer s/p nephrectomy, OA, HTN, GERD, prior neck fusion and back surgery.  PCP is Dr. Kerby Nora.  CXR on 03/08/12 showed: The cardiac silhouette, mediastinal and hilar contours  are stable. The lungs are clear of acute process. Stable basilar scarring changes. Stable rounded density at the right lung base.   EKG on 03/08/12 showed NSR.  Lab reviewed.  His BUN 25.  Cr is up to 1.81 (previously 1.3 on 01/30/12).  Glucose is 112.  H/H 13.8/41.2.  PLT 192.    Meds include Diovan, amlodipine, amitriptyline, Adderall, Dalmane, tramadol.   I've spoke with Dr. Ermalene Searing earlier today.  She agrees with having him hold Diovan until at least post-operative and rechecking a BMET on arrival to insure no further elevation in his renal function.  I spoke with [name].  He denies any difficulty with urination or urinary retention, N/V/D.  He is on tramadol but has only taken three tablets in the past 48 hours.  He is not taking ibuprofen or other OTC NSAIDS.  He has been working outside quite a bit, but has been trying to stay hydrated.  I instructed him to hold his Diovan until at least we are able to follow-up on his renal function and encouraged him to stay hydrated.  He will call Dr. Daphine Deutscher office if he develops any significant hypertension in the interim.  He may continue his amlodipine.  He was encouraged to schedule a post-operative follow-up appointment with Dr. Ermalene Searing to further discuss evaluation/treatment of his acute renal insufficiency.  I updated Erie Noe at Dr. Lindalou Hose office earlier today.  Anticipate he can proceed if his BUN/Cr stable/improved on the day of surgery.  Shonna Chock, PA-C

## 2012-03-11 MED ORDER — CEFAZOLIN SODIUM 1-5 GM-% IV SOLN: 1.0000 g | INTRAVENOUS | Status: DC

## 2012-03-12 ENCOUNTER — Encounter (HOSPITAL_COMMUNITY): Payer: Self-pay | Admitting: Vascular Surgery

## 2012-03-12 ENCOUNTER — Ambulatory Visit (HOSPITAL_COMMUNITY): Payer: PRIVATE HEALTH INSURANCE | Admitting: Vascular Surgery

## 2012-03-12 ENCOUNTER — Ambulatory Visit (HOSPITAL_COMMUNITY): Payer: PRIVATE HEALTH INSURANCE

## 2012-03-12 ENCOUNTER — Encounter (HOSPITAL_COMMUNITY)
Admission: RE | Disposition: A | Discharge status: Home / Self Care | Payer: Self-pay | Source: Ambulatory Visit | Attending: Neurosurgery

## 2012-03-12 ENCOUNTER — Ambulatory Visit (HOSPITAL_COMMUNITY)
Admission: RE | Admit: 2012-03-12 | Discharge: 2012-03-13 | Disposition: A | Discharge status: Home / Self Care | Payer: PRIVATE HEALTH INSURANCE | Source: Ambulatory Visit | Attending: Neurosurgery | Admitting: Neurosurgery

## 2012-03-12 ENCOUNTER — Encounter (HOSPITAL_COMMUNITY): Payer: Self-pay | Admitting: *Deleted

## 2012-03-12 DIAGNOSIS — K219 Gastro-esophageal reflux disease without esophagitis: Secondary | ICD-10-CM | POA: Insufficient documentation

## 2012-03-12 DIAGNOSIS — M48062 Spinal stenosis, lumbar region with neurogenic claudication: Secondary | ICD-10-CM | POA: Insufficient documentation

## 2012-03-12 DIAGNOSIS — I10 Essential (primary) hypertension: Secondary | ICD-10-CM | POA: Insufficient documentation

## 2012-03-12 DIAGNOSIS — J449 Chronic obstructive pulmonary disease, unspecified: Secondary | ICD-10-CM | POA: Insufficient documentation

## 2012-03-12 DIAGNOSIS — Z01818 Encounter for other preprocedural examination: Secondary | ICD-10-CM | POA: Insufficient documentation

## 2012-03-12 DIAGNOSIS — Z01812 Encounter for preprocedural laboratory examination: Secondary | ICD-10-CM | POA: Insufficient documentation

## 2012-03-12 DIAGNOSIS — J4489 Other specified chronic obstructive pulmonary disease: Secondary | ICD-10-CM | POA: Insufficient documentation

## 2012-03-12 DIAGNOSIS — Z01810 Encounter for preprocedural cardiovascular examination: Secondary | ICD-10-CM | POA: Insufficient documentation

## 2012-03-12 HISTORY — PX: LUMBAR LAMINECTOMY/DECOMPRESSION MICRODISCECTOMY: SHX5026

## 2012-03-12 LAB — BASIC METABOLIC PANEL
BUN: 22 mg/dL (ref 6–23)
CO2: 29 mEq/L (ref 19–32)
Chloride: 104 mEq/L (ref 96–112)
Glucose, Bld: 100 mg/dL — ABNORMAL HIGH (ref 70–99)
Potassium: 5.2 mEq/L — ABNORMAL HIGH (ref 3.5–5.1)
Sodium: 139 mEq/L (ref 135–145)

## 2012-03-12 SURGERY — LUMBAR LAMINECTOMY/DECOMPRESSION MICRODISCECTOMY 2 LEVELS
Anesthesia: General | Site: Back | Laterality: Bilateral | Wound class: Clean

## 2012-03-12 MED ORDER — ONDANSETRON HCL 4 MG/2ML IJ SOLN
INTRAMUSCULAR | Status: DC | PRN
  Administered 2012-03-12: 4 mg via INTRAVENOUS

## 2012-03-12 MED ORDER — PROPOFOL 10 MG/ML IV EMUL
INTRAVENOUS | Status: DC | PRN
  Administered 2012-03-12: 170 mg via INTRAVENOUS

## 2012-03-12 MED ORDER — HYDROMORPHONE HCL PF 1 MG/ML IJ SOLN
0.2500 mg | INTRAMUSCULAR | Status: DC | PRN
  Administered 2012-03-12 (×3): 0.5 mg via INTRAVENOUS

## 2012-03-12 MED ORDER — CYCLOBENZAPRINE HCL 10 MG PO TABS: 10.0000 mg | ORAL_TABLET | Freq: Three times a day (TID) | ORAL | Status: DC | PRN

## 2012-03-12 MED ORDER — PHENOL 1.4 % MT LIQD: 1.0000 | OROMUCOSAL | Status: DC | PRN

## 2012-03-12 MED ORDER — AMLODIPINE BESYLATE 5 MG PO TABS
5.0000 mg | ORAL_TABLET | Freq: Every day | ORAL | Status: DC
  Administered 2012-03-13: 5 mg via ORAL
  Filled 2012-03-12: qty 1

## 2012-03-12 MED ORDER — FENTANYL CITRATE 0.05 MG/ML IJ SOLN
INTRAMUSCULAR | Status: DC | PRN
  Administered 2012-03-12 (×2): 100 ug via INTRAVENOUS

## 2012-03-12 MED ORDER — SODIUM CHLORIDE 0.9 % IJ SOLN: 3.0000 mL | INTRAMUSCULAR | Status: DC | PRN

## 2012-03-12 MED ORDER — THROMBIN 5000 UNITS EX KIT
PACK | CUTANEOUS | Status: DC | PRN
  Administered 2012-03-12 (×2): 5000 [IU] via TOPICAL

## 2012-03-12 MED ORDER — NEOSTIGMINE METHYLSULFATE 1 MG/ML IJ SOLN
INTRAMUSCULAR | Status: DC | PRN
  Administered 2012-03-12: 3.5 mg via INTRAVENOUS

## 2012-03-12 MED ORDER — SODIUM CHLORIDE 0.9 % IV SOLN
INTRAVENOUS | Status: DC | PRN
  Administered 2012-03-12: 14:00:00 via INTRAVENOUS

## 2012-03-12 MED ORDER — AMITRIPTYLINE HCL 50 MG PO TABS
50.0000 mg | ORAL_TABLET | Freq: Every day | ORAL | Status: DC
  Administered 2012-03-12: 50 mg via ORAL
  Filled 2012-03-12 (×2): qty 1

## 2012-03-12 MED ORDER — IRBESARTAN 150 MG PO TABS
150.0000 mg | ORAL_TABLET | Freq: Every day | ORAL | Status: DC
  Filled 2012-03-12 (×2): qty 1

## 2012-03-12 MED ORDER — PHENYLEPHRINE HCL 10 MG/ML IJ SOLN
INTRAMUSCULAR | Status: DC | PRN
  Administered 2012-03-12 (×2): 120 ug via INTRAVENOUS
  Administered 2012-03-12: 40 ug via INTRAVENOUS
  Administered 2012-03-12: 120 ug via INTRAVENOUS

## 2012-03-12 MED ORDER — SODIUM CHLORIDE 0.9 % IJ SOLN
3.0000 mL | Freq: Two times a day (BID) | INTRAMUSCULAR | Status: DC
  Administered 2012-03-12 – 2012-03-13 (×2): 3 mL via INTRAVENOUS

## 2012-03-12 MED ORDER — GLYCOPYRROLATE 0.2 MG/ML IJ SOLN
INTRAMUSCULAR | Status: DC | PRN
  Administered 2012-03-12: .5 mg via INTRAVENOUS

## 2012-03-12 MED ORDER — BACITRACIN 50000 UNITS IM SOLR
INTRAMUSCULAR | Status: AC
  Filled 2012-03-12: qty 1

## 2012-03-12 MED ORDER — HYDROMORPHONE HCL PF 1 MG/ML IJ SOLN
INTRAMUSCULAR | Status: AC
  Filled 2012-03-12: qty 1

## 2012-03-12 MED ORDER — FLEET ENEMA 7-19 GM/118ML RE ENEM
1.0000 | ENEMA | Freq: Once | RECTAL | Status: AC | PRN
  Filled 2012-03-12: qty 1

## 2012-03-12 MED ORDER — VECURONIUM BROMIDE 10 MG IV SOLR
INTRAVENOUS | Status: DC | PRN
  Administered 2012-03-12 (×2): 2 mg via INTRAVENOUS

## 2012-03-12 MED ORDER — KETOROLAC TROMETHAMINE 30 MG/ML IJ SOLN
30.0000 mg | Freq: Four times a day (QID) | INTRAMUSCULAR | Status: DC
  Administered 2012-03-12 – 2012-03-13 (×3): 30 mg via INTRAVENOUS
  Filled 2012-03-12 (×6): qty 1

## 2012-03-12 MED ORDER — ACETAMINOPHEN 650 MG RE SUPP: 650.0000 mg | RECTAL | Status: DC | PRN

## 2012-03-12 MED ORDER — HEMOSTATIC AGENTS (NO CHARGE) OPTIME
TOPICAL | Status: DC | PRN
  Administered 2012-03-12: 1 via TOPICAL

## 2012-03-12 MED ORDER — ALUM & MAG HYDROXIDE-SIMETH 200-200-20 MG/5ML PO SUSP
30.0000 mL | Freq: Four times a day (QID) | ORAL | Status: DC | PRN
  Administered 2012-03-12 – 2012-03-13 (×2): 30 mL via ORAL
  Filled 2012-03-12 (×2): qty 30

## 2012-03-12 MED ORDER — LACTATED RINGERS IV SOLN
INTRAVENOUS | Status: DC | PRN
  Administered 2012-03-12: 11:00:00 via INTRAVENOUS

## 2012-03-12 MED ORDER — CEFAZOLIN SODIUM-DEXTROSE 2-3 GM-% IV SOLR
2.0000 g | INTRAVENOUS | Status: AC
  Administered 2012-03-12: 2 g via INTRAVENOUS

## 2012-03-12 MED ORDER — HYDROMORPHONE HCL PF 1 MG/ML IJ SOLN: 0.5000 mg | INTRAMUSCULAR | Status: DC | PRN

## 2012-03-12 MED ORDER — AMPHETAMINE-DEXTROAMPHETAMINE 20 MG PO TABS: 20.0000 mg | ORAL_TABLET | Freq: Four times a day (QID) | ORAL | Status: DC | PRN

## 2012-03-12 MED ORDER — BUPIVACAINE HCL (PF) 0.25 % IJ SOLN
INTRAMUSCULAR | Status: DC | PRN
  Administered 2012-03-12: 20 mL

## 2012-03-12 MED ORDER — ACETAMINOPHEN 325 MG PO TABS: 650.0000 mg | ORAL_TABLET | ORAL | Status: DC | PRN

## 2012-03-12 MED ORDER — OXYCODONE-ACETAMINOPHEN 5-325 MG PO TABS: 1.0000 | ORAL_TABLET | ORAL | Status: DC | PRN

## 2012-03-12 MED ORDER — SODIUM CHLORIDE 0.9 % IR SOLN
Status: DC | PRN
  Administered 2012-03-12: 13:00:00

## 2012-03-12 MED ORDER — 0.9 % SODIUM CHLORIDE (POUR BTL) OPTIME
TOPICAL | Status: DC | PRN
  Administered 2012-03-12: 1000 mL

## 2012-03-12 MED ORDER — TRAMADOL HCL 50 MG PO TABS
50.0000 mg | ORAL_TABLET | Freq: Four times a day (QID) | ORAL | Status: DC | PRN
  Administered 2012-03-13: 50 mg via ORAL
  Filled 2012-03-12: qty 1

## 2012-03-12 MED ORDER — SURGIFOAM 100 EX MISC
CUTANEOUS | Status: DC | PRN
  Administered 2012-03-12: 14:00:00 via TOPICAL

## 2012-03-12 MED ORDER — POLYETHYLENE GLYCOL 3350 17 G PO PACK
17.0000 g | PACK | Freq: Every day | ORAL | Status: DC | PRN
  Filled 2012-03-12: qty 1

## 2012-03-12 MED ORDER — ROCURONIUM BROMIDE 100 MG/10ML IV SOLN
INTRAVENOUS | Status: DC | PRN
  Administered 2012-03-12: 50 mg via INTRAVENOUS

## 2012-03-12 MED ORDER — SODIUM CHLORIDE 0.9 % IV SOLN
10.0000 mg | INTRAVENOUS | Status: DC | PRN
  Administered 2012-03-12: 25 ug/min via INTRAVENOUS

## 2012-03-12 MED ORDER — ZOLPIDEM TARTRATE 5 MG PO TABS: 5.0000 mg | ORAL_TABLET | Freq: Every evening | ORAL | Status: DC | PRN

## 2012-03-12 MED ORDER — BISACODYL 10 MG RE SUPP: 10.0000 mg | Freq: Every day | RECTAL | Status: DC | PRN

## 2012-03-12 MED ORDER — DEXAMETHASONE SODIUM PHOSPHATE 10 MG/ML IJ SOLN
10.0000 mg | INTRAMUSCULAR | Status: AC
  Administered 2012-03-12: 10 mg via INTRAVENOUS
  Filled 2012-03-12: qty 1

## 2012-03-12 MED ORDER — SENNA 8.6 MG PO TABS
1.0000 | ORAL_TABLET | Freq: Two times a day (BID) | ORAL | Status: DC
  Administered 2012-03-12 – 2012-03-13 (×2): 8.6 mg via ORAL
  Filled 2012-03-12 (×3): qty 1

## 2012-03-12 MED ORDER — TEMAZEPAM 15 MG PO CAPS
15.0000 mg | ORAL_CAPSULE | Freq: Every evening | ORAL | Status: DC | PRN
  Administered 2012-03-12: 15 mg via ORAL
  Filled 2012-03-12: qty 1

## 2012-03-12 MED ORDER — SODIUM CHLORIDE 0.9 % IV SOLN: 250.0000 mL | INTRAVENOUS | Status: DC

## 2012-03-12 MED ORDER — MIDAZOLAM HCL 5 MG/5ML IJ SOLN
INTRAMUSCULAR | Status: DC | PRN
  Administered 2012-03-12: 2 mg via INTRAVENOUS

## 2012-03-12 MED ORDER — CEFAZOLIN SODIUM-DEXTROSE 2-3 GM-% IV SOLR
INTRAVENOUS | Status: AC
  Filled 2012-03-12: qty 50

## 2012-03-12 MED ORDER — HYDROCODONE-ACETAMINOPHEN 5-325 MG PO TABS
1.0000 | ORAL_TABLET | ORAL | Status: DC | PRN
  Administered 2012-03-13: 1 via ORAL
  Filled 2012-03-12: qty 1

## 2012-03-12 MED ORDER — LIDOCAINE HCL (CARDIAC) 20 MG/ML IV SOLN
INTRAVENOUS | Status: DC | PRN
  Administered 2012-03-12: 75 mg via INTRAVENOUS

## 2012-03-12 MED ORDER — MENTHOL 3 MG MT LOZG: 1.0000 | LOZENGE | OROMUCOSAL | Status: DC | PRN

## 2012-03-12 MED ORDER — ONDANSETRON HCL 4 MG/2ML IJ SOLN: 4.0000 mg | INTRAMUSCULAR | Status: DC | PRN

## 2012-03-12 MED ORDER — KETOROLAC TROMETHAMINE 30 MG/ML IJ SOLN
INTRAMUSCULAR | Status: DC | PRN
  Administered 2012-03-12: 30 mg via INTRAVENOUS

## 2012-03-12 MED ORDER — CEFAZOLIN SODIUM 1-5 GM-% IV SOLN
1.0000 g | Freq: Three times a day (TID) | INTRAVENOUS | Status: AC
  Administered 2012-03-12 – 2012-03-13 (×2): 1 g via INTRAVENOUS
  Filled 2012-03-12 (×2): qty 50

## 2012-03-12 MED ORDER — SODIUM CHLORIDE 0.9 % IV SOLN
INTRAVENOUS | Status: AC
  Filled 2012-03-12: qty 500

## 2012-03-12 SURGICAL SUPPLY — 54 items
BAG DECANTER FOR FLEXI CONT (MISCELLANEOUS) ×2 IMPLANT
BENZOIN TINCTURE PRP APPL 2/3 (GAUZE/BANDAGES/DRESSINGS) ×2 IMPLANT
BLADE SURG ROTATE 9660 (MISCELLANEOUS) IMPLANT
BRUSH SCRUB EZ PLAIN DRY (MISCELLANEOUS) ×2 IMPLANT
BUR CUTTER 7.0 ROUND (BURR) ×2 IMPLANT
CANISTER SUCTION 2500CC (MISCELLANEOUS) ×2 IMPLANT
CLOTH BEACON ORANGE TIMEOUT ST (SAFETY) ×2 IMPLANT
CONT SPEC 4OZ CLIKSEAL STRL BL (MISCELLANEOUS) ×2 IMPLANT
DECANTER SPIKE VIAL GLASS SM (MISCELLANEOUS) ×2 IMPLANT
DERMABOND ADVANCED (GAUZE/BANDAGES/DRESSINGS) ×1
DERMABOND ADVANCED .7 DNX12 (GAUZE/BANDAGES/DRESSINGS) ×1 IMPLANT
DRAPE LAPAROTOMY 100X72X124 (DRAPES) ×2 IMPLANT
DRAPE MICROSCOPE ZEISS OPMI (DRAPES) IMPLANT
DRAPE POUCH INSTRU U-SHP 10X18 (DRAPES) ×2 IMPLANT
DRAPE PROXIMA HALF (DRAPES) IMPLANT
DRAPE SURG 17X23 STRL (DRAPES) ×4 IMPLANT
ELECT REM PT RETURN 9FT ADLT (ELECTROSURGICAL) ×2
ELECTRODE REM PT RTRN 9FT ADLT (ELECTROSURGICAL) ×1 IMPLANT
EVACUATOR 1/8 PVC DRAIN (DRAIN) ×2 IMPLANT
GAUZE SPONGE 4X4 16PLY XRAY LF (GAUZE/BANDAGES/DRESSINGS) IMPLANT
GLOVE BIOGEL PI IND STRL 7.0 (GLOVE) ×1 IMPLANT
GLOVE BIOGEL PI IND STRL 8 (GLOVE) ×1 IMPLANT
GLOVE BIOGEL PI INDICATOR 7.0 (GLOVE) ×1
GLOVE BIOGEL PI INDICATOR 8 (GLOVE) ×1
GLOVE ECLIPSE 7.5 STRL STRAW (GLOVE) ×4 IMPLANT
GLOVE ECLIPSE 8.5 STRL (GLOVE) ×4 IMPLANT
GLOVE EXAM NITRILE LRG STRL (GLOVE) IMPLANT
GLOVE EXAM NITRILE MD LF STRL (GLOVE) ×2 IMPLANT
GLOVE EXAM NITRILE XL STR (GLOVE) IMPLANT
GLOVE EXAM NITRILE XS STR PU (GLOVE) IMPLANT
GLOVE SS BIOGEL STRL SZ 6.5 (GLOVE) ×1 IMPLANT
GLOVE SS N UNI LF 7.0 STRL (GLOVE) ×2 IMPLANT
GLOVE SUPERSENSE BIOGEL SZ 6.5 (GLOVE) ×1
GOWN BRE IMP SLV AUR LG STRL (GOWN DISPOSABLE) ×2 IMPLANT
GOWN BRE IMP SLV AUR XL STRL (GOWN DISPOSABLE) ×2 IMPLANT
GOWN STRL REIN 2XL LVL4 (GOWN DISPOSABLE) ×4 IMPLANT
KIT BASIN OR (CUSTOM PROCEDURE TRAY) ×2 IMPLANT
KIT ROOM TURNOVER OR (KITS) ×2 IMPLANT
NEEDLE HYPO 22GX1.5 SAFETY (NEEDLE) ×2 IMPLANT
NS IRRIG 1000ML POUR BTL (IV SOLUTION) ×2 IMPLANT
PACK LAMINECTOMY NEURO (CUSTOM PROCEDURE TRAY) ×2 IMPLANT
PAD ARMBOARD 7.5X6 YLW CONV (MISCELLANEOUS) ×6 IMPLANT
RUBBERBAND STERILE (MISCELLANEOUS) IMPLANT
SPONGE GAUZE 4X4 12PLY (GAUZE/BANDAGES/DRESSINGS) ×2 IMPLANT
SPONGE SURGIFOAM ABS GEL 100C (HEMOSTASIS) ×2 IMPLANT
SPONGE SURGIFOAM ABS GEL SZ50 (HEMOSTASIS) ×2 IMPLANT
STRIP CLOSURE SKIN 1/2X4 (GAUZE/BANDAGES/DRESSINGS) ×2 IMPLANT
SUT VIC AB 2-0 CT1 18 (SUTURE) ×4 IMPLANT
SUT VIC AB 3-0 SH 8-18 (SUTURE) ×2 IMPLANT
SYR 20ML ECCENTRIC (SYRINGE) ×2 IMPLANT
TAPE CLOTH SURG 4X10 WHT LF (GAUZE/BANDAGES/DRESSINGS) ×2 IMPLANT
TOWEL OR 17X24 6PK STRL BLUE (TOWEL DISPOSABLE) ×2 IMPLANT
TOWEL OR 17X26 10 PK STRL BLUE (TOWEL DISPOSABLE) ×2 IMPLANT
WATER STERILE IRR 1000ML POUR (IV SOLUTION) ×2 IMPLANT

## 2012-03-12 NOTE — Progress Notes (Signed)
Pt. Using left arm to move right hand,is able to squeeze and hold arm up,pt. States arm was like this before surgery.

## 2012-03-12 NOTE — Transfer of Care (Signed)
Immediate Anesthesia Transfer of Care Note  Patient: Pedro [name]  Procedure(s) Performed: Procedure(s) (LRB): LUMBAR LAMINECTOMY/DECOMPRESSION MICRODISCECTOMY 2 LEVELS (Bilateral)  Patient Location: PACU  Anesthesia Type: General  Level of Consciousness: awake, oriented and responds to stimulation  Airway & Oxygen Therapy: Patient Spontanous Breathing and Patient connected to nasal cannula oxygen  Post-op Assessment: Report given to PACU RN and Patient moving all extremities X 4  Post vital signs: Reviewed and stable  Complications: No apparent anesthesia complications

## 2012-03-12 NOTE — H&P (Signed)
Pedro [name] is an 66 y.o. male.   Chief Complaint: Back pain and bilateral leg pain HPI: 66 year old male with back and bilateral lower extremity pain consistent with neurogenic claudication failing conservative measure workup demonstrates evidence of marked lumbar stenosis at L2-3 and L3-4. Patient presents now for decompressive surgery.  Past Medical History  Diagnosis Date  . ADD (attention deficit disorder)   . Alcohol abuse, in remission   . Allergic rhinitis, cause unspecified   . Chronic airway obstruction, not elsewhere classified   . Cough   . Depressive disorder, not elsewhere classified   . Diverticulitis   . GERD (gastroesophageal reflux disease)   . Pure hypercholesterolemia   . Unspecified essential hypertension   . Insomnia   . Osteoarthritis     hands  . Routine general medical examination at a health care facility   . Renal cell cancer   . Special screening for malignant neoplasm of prostate   . Tobacco use disorder   . Pneumonia     As a child  . Anxiety   . Constipation     Past Surgical History  Procedure Date  . Neck fusion   . Inguinal hernia repair     bilateral  . Appendectomy   . Nephrectomy 2010    Right kidney for renal cell cancer  . Tonsillectomy   . Eye surgery   . Back surgery     Partial diskectomy    Family History  Problem Relation Age of Onset  . Cancer Mother     lung  . Hyperlipidemia Mother   . Cancer Father     lung  . Hyperlipidemia Father   . Coronary artery disease Brother   . Heart attack Brother     age 75   Social History:  reports that he has been smoking Cigarettes.  He has a 40 pack-year smoking history. He does not have any smokeless tobacco history on file. He reports that he does not drink alcohol or use illicit drugs.  Allergies:  Allergies  Allergen Reactions  . Bee Venom Anaphylaxis    Unknown    Medications Prior to Admission  Medication Sig Dispense Refill  . amitriptyline (ELAVIL) 50 MG tablet  Take 50 mg by mouth at bedtime.      Marland Kitchen amLODipine (NORVASC) 5 MG tablet TAKE ONE TABLET BY MOUTH ONE TIME DAILY  30 tablet  11  . amphetamine-dextroamphetamine (ADDERALL) 20 MG tablet Take 20 mg by mouth 4 (four) times daily.      Marland Kitchen DIOVAN 160 MG tablet TAKE ONE TABLET BY MOUTH ONE TIME DAILY  30 each  11  . flurazepam (DALMANE) 30 MG capsule Take 2 capsules by mouth at bedtime.      . traMADol (ULTRAM) 50 MG tablet Take 50 mg by mouth every 6 (six) hours as needed. For pain        Results for orders placed during the hospital encounter of 03/12/12 (from the past 48 hour(s))  BASIC METABOLIC PANEL     Status: Abnormal   Collection Time   03/12/12  9:32 AM      Component Value Range Comment   Sodium 139  135 - 145 mEq/L    Potassium 5.2 (*) 3.5 - 5.1 mEq/L    Chloride 104  96 - 112 mEq/L    CO2 29  19 - 32 mEq/L    Glucose, Bld 100 (*) 70 - 99 mg/dL    BUN 22  6 - 23  mg/dL    Creatinine, Ser 0.45  0.50 - 1.35 mg/dL    Calcium 8.9  8.4 - 40.9 mg/dL    GFR calc non Af Amer 55 (*) >90 mL/min    GFR calc Af Amer 64 (*) >90 mL/min    No results found.  Review of Systems  Constitutional: Negative.   HENT: Negative.   Eyes: Negative.   Respiratory: Negative.   Cardiovascular: Negative.   Gastrointestinal: Negative.   Genitourinary: Negative.   Musculoskeletal: Negative.   Skin: Negative.   Neurological: Negative.   Endo/Heme/Allergies: Negative.   Psychiatric/Behavioral: Negative.     Blood pressure 131/79, pulse 100, temperature 98.2 F (36.8 C), temperature source Oral, resp. rate 18, SpO2 95.00%. Physical Exam  Constitutional: He is oriented to person, place, and time. He appears well-developed and well-nourished. No distress.  HENT:  Head: Normocephalic and atraumatic.  Right Ear: External ear normal.  Left Ear: External ear normal.  Nose: Nose normal.  Mouth/Throat: No oropharyngeal exudate.  Eyes: Conjunctivae and EOM are normal. Pupils are equal, round, and reactive to  light. Right eye exhibits no discharge. Left eye exhibits no discharge.  Neck: Normal range of motion. Neck supple. No tracheal deviation present. No thyromegaly present.  Cardiovascular: Normal rate, regular rhythm, normal heart sounds and intact distal pulses.   Respiratory: Effort normal and breath sounds normal. No respiratory distress. He has no wheezes.  GI: Soft. Bowel sounds are normal. He exhibits no distension. There is no tenderness.  Musculoskeletal: Normal range of motion. He exhibits no edema and no tenderness.  Neurological: He is alert and oriented to person, place, and time. He has normal reflexes. He displays normal reflexes. No cranial nerve deficit. He exhibits normal muscle tone. Coordination normal.  Skin: Skin is warm and dry. No rash noted. He is not diaphoretic. No erythema. No pallor.  Psychiatric: He has a normal mood and affect. His behavior is normal. Judgment and thought content normal.     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 Plan L2-3-4 decompressive laminectomy with foraminotomies. Risks and benefits have been explained. Patient wishes to proceed.  Pedro [name] A 03/12/2012, 12:15 PM

## 2012-03-12 NOTE — Anesthesia Preprocedure Evaluation (Addendum)
Anesthesia Evaluation  Patient identified by MRN, date of birth, ID band Patient awake    Reviewed: Allergy & Precautions, H&P , NPO status , Patient's Chart, lab work & pertinent test results  Airway Mallampati: II      Dental  (+) Missing, Poor Dentition and Dental Advisory Given   Pulmonary pneumonia -, COPD breath sounds clear to auscultation        Cardiovascular hypertension, Pt. on medications Rhythm:Regular Rate:Normal     Neuro/Psych    GI/Hepatic Neg liver ROS, GERD-  ,  Endo/Other  negative endocrine ROS  Renal/GU History of Right Nephrectomy.     Musculoskeletal   Abdominal   Peds  Hematology   Anesthesia Other Findings   Reproductive/Obstetrics                          Anesthesia Physical Anesthesia Plan  ASA: III  Anesthesia Plan: General   Post-op Pain Management:    Induction: Intravenous  Airway Management Planned: Oral ETT  Additional Equipment:   Intra-op Plan:   Post-operative Plan: Extubation in OR  Informed Consent:   Plan Discussed with: CRNA and Anesthesiologist  Anesthesia Plan Comments:         Anesthesia Quick Evaluation

## 2012-03-12 NOTE — Op Note (Signed)
Date of procedure: 12/14/2011  Date of dictation: Same  Service: Neurosurgery  Preoperative diagnosis: L2-3 L3-4 stenosis with neurogenic claudication  Postoperative diagnosis: Same  Procedure Name: L2-3 L3-4 decompressive laminectomy with bilateral L2-L3 and L4 decompressive foraminotomies.  Surgeon:Meagen Limones A.Enis Riecke, M.D.  Asst. Surgeon: Danielle Dess  Anesthesia: General  Indication: 66 year old male with back and bilateral lower chamois symptoms consistent with neurogenic claudication. Workup demonstrates evidence of marked lumbar stenosis at L2-3 and L3-4. Patient presents now for decompressive surgery in hopes of improving his symptoms.  Operative note: After induction anesthesia, patient positioned prone on the Wilson frame purply padded patient lumbar region prepped and draped her per tablet linear skin incision overlying the L2-3-4 levels. The skin and sharply in the midline. Supper off Sexton then performed exposing the lamina and facet joints of L2-3 and 4. Deep self-retaining first placed intraoperative x-rays taken levels were confirmed. Decompressive laminectomies then performed using Leksell orders Veleta Miners is a high-speed drill to remove the entire lamina of L2 entire lamina of L3 and the superior aspect of lamina of L4. Medial facetectomies were performed bilaterally at L2-3 and L3-4. Lateral gutters were undercut using Kerrison rongeurs. Wide decompressive foraminotomies were performed on of course exiting L2-L3 and L4 nerve roots bilaterally. This point a very thorough decompression achieved. There is no his injury thecal sac and nerve roots. Wounds an area solution. A medium Hemovac drain was left in the epidural space. Wounds and close in layers with Vicryl sutures. Steri-Strips triggers were applied. There were no apparent complications. Patient tolerated the procedure well and returned to recovery postop.

## 2012-03-12 NOTE — Anesthesia Postprocedure Evaluation (Signed)
  Anesthesia Post-op Note  Patient: Pedro [name]  Procedure(s) Performed: Procedure(s) (LRB): LUMBAR LAMINECTOMY/DECOMPRESSION MICRODISCECTOMY 2 LEVELS (Bilateral)  Patient Location: PACU and NICU  Anesthesia Type: General  Level of Consciousness: awake, alert , oriented and patient cooperative  Airway and Oxygen Therapy: Patient Spontanous Breathing and Patient connected to nasal cannula oxygen  Post-op Pain: mild  Post-op Assessment: Post-op Vital signs reviewed, Patient's Cardiovascular Status Stable, Respiratory Function Stable, Patent Airway, No signs of Nausea or vomiting and Pain level controlled  Post-op Vital Signs: stable  Complications: No apparent anesthesia complications

## 2012-03-12 NOTE — Plan of Care (Signed)
Problem: Consults Goal: Diagnosis - Spinal Surgery Outcome: Completed/Met Date Met:  03/12/12 LAMINECTOMY / MICRODISCECTOMY

## 2012-03-12 NOTE — Preoperative (Signed)
Beta Blockers   Reason not to administer Beta Blockers:Not Applicable 

## 2012-03-12 NOTE — Brief Op Note (Signed)
03/12/2012  2:22 PM  PATIENT:  Pedro [name] Kitchen  66 y.o. male  PRE-OPERATIVE DIAGNOSIS:  Lumbar two-three, three-four stenosis  POST-OPERATIVE DIAGNOSIS:  Lumbar two-three, three-four stenosis  PROCEDURE:  Procedure(s) (LRB): LUMBAR LAMINECTOMY/DECOMPRESSION MICRODISCECTOMY 2 LEVELS (Bilateral)  SURGEON:  Surgeon(s) and Role:    * Temple Pacini, MD - Primary    * Barnett Abu, MD - Assisting  PHYSICIAN ASSISTANT:   ASSISTANTS:    ANESTHESIA:   general  EBL:  Total I/O In: 1000 [I.V.:1000] Out: 250 [Blood:250]  BLOOD ADMINISTERED:none  DRAINS: (Medium) Hemovact drain(s) in the Epidural space with  Suction Open   LOCAL MEDICATIONS USED:  MARCAINE     SPECIMEN:  No Specimen  DISPOSITION OF SPECIMEN:  Not applicable  COUNTS:  YES  TOURNIQUET:  * No tourniquets in log *  DICTATION: .Dragon Dictation  PLAN OF CARE: Admit for overnight observation  PATIENT DISPOSITION:  PACU - hemodynamically stable.   Delay start of Pharmacological VTE agent (>24hrs) due to surgical blood loss or risk of bleeding: yes

## 2012-03-12 NOTE — Anesthesia Procedure Notes (Signed)
Procedure Name: Intubation Date/Time: 03/12/2012 12:36 PM Performed by: Jefm Miles E Pre-anesthesia Checklist: Patient identified, Timeout performed, Emergency Drugs available, Suction available and Patient being monitored Patient Re-evaluated:Patient Re-evaluated prior to inductionOxygen Delivery Method: Circle system utilized Preoxygenation: Pre-oxygenation with 100% oxygen Intubation Type: IV induction Ventilation: Mask ventilation without difficulty Laryngoscope Size: Mac and 3 Grade View: Grade I Tube type: Oral Tube size: 7.5 mm Number of attempts: 1 Airway Equipment and Method: Stylet Placement Confirmation: ETT inserted through vocal cords under direct vision,  breath sounds checked- equal and bilateral and positive ETCO2 Secured at: 22 cm Tube secured with: Tape Dental Injury: Teeth and Oropharynx as per pre-operative assessment

## 2012-03-13 ENCOUNTER — Encounter (HOSPITAL_COMMUNITY): Payer: Self-pay | Admitting: Neurosurgery

## 2012-03-13 MED ORDER — HYDROCODONE-ACETAMINOPHEN 5-325 MG PO TABS: 1.0000 | ORAL_TABLET | ORAL | Status: AC | PRN

## 2012-03-13 MED ORDER — CYCLOBENZAPRINE HCL 10 MG PO TABS: 10.0000 mg | ORAL_TABLET | Freq: Three times a day (TID) | ORAL | Status: AC | PRN

## 2012-03-13 NOTE — Discharge Summary (Signed)
Physician Discharge Summary  Patient ID: Pedro [name] MRN: [id number] DOB/AGE: [birth year] 66 y.o.  Admit date: 03/12/2012 Discharge date: 03/13/2012  Admission Diagnoses:  Discharge Diagnoses:  Principal Problem:  *Lumbar stenosis with neurogenic claudication   Discharged Condition: good  Hospital Course: Patient admitted to the hospital where he underwent uncomplicated lumbar decompression with foraminotomies. Postoperatively he did very well peer back and lower trimming pain much improved. Sensation intact. Wound healing well. Plan for discharge home.  Consults:   Significant Diagnostic Studies:   Treatments:   Discharge Exam: Blood pressure 118/73, pulse 108, temperature 98.6 F (37 C), temperature source Oral, resp. rate 18, SpO2 97.00%. Awake alert oriented and appropriate cranial Nerve function is intact. Motor and sensory function extremities is normal. Wound clean dry and intact. Chest and abdomen benign.  Disposition:   Discharge Orders    Future Appointments: Provider: Department: Dept Phone: Center:   08/02/2012 2:00 PM [facility] [id number]     Medication List  As of 03/13/2012  1:01 PM   TAKE these medications         amitriptyline 50 MG tablet   Commonly known as: ELAVIL   Take 50 mg by mouth at bedtime.      amLODipine 5 MG tablet   Commonly known as: NORVASC   TAKE ONE TABLET BY MOUTH ONE TIME DAILY      amphetamine-dextroamphetamine 20 MG tablet   Commonly known as: ADDERALL   Take 20 mg by mouth 4 (four) times daily.      cyclobenzaprine 10 MG tablet   Commonly known as: FLEXERIL   Take 1 tablet (10 mg total) by mouth 3 (three) times daily as needed for muscle spasms.      DIOVAN 160 MG tablet   Generic drug: valsartan   TAKE ONE TABLET BY MOUTH ONE TIME DAILY      flurazepam 30 MG capsule   Commonly known as: DALMANE   Take 2 capsules by mouth at bedtime.      HYDROcodone-acetaminophen 5-325 MG per tablet   Commonly known as: NORCO/VICODIN   Take 1-2 tablets by mouth every 4 (four) hours as needed.      traMADol 50 MG tablet   Commonly known as: ULTRAM   Take 50 mg by mouth every 6 (six) hours as needed. For pain           Follow-up Information    Call Temple Pacini, MD. (Ask for Lurena Joiner)    Contact information:   [address] [place] [address] [phone]          Signed: Temple Pacini 03/13/2012, 1:01 PM

## 2012-08-02 ENCOUNTER — Other Ambulatory Visit (INDEPENDENT_AMBULATORY_CARE_PROVIDER_SITE_OTHER): Payer: PRIVATE HEALTH INSURANCE

## 2012-08-02 DIAGNOSIS — R972 Elevated prostate specific antigen [PSA]: Secondary | ICD-10-CM

## 2012-08-02 NOTE — Addendum Note (Signed)
Addended by: Josph Macho A on: 08/02/2012 02:02 PM   Modules accepted: Orders

## 2012-08-16 ENCOUNTER — Ambulatory Visit: Payer: Self-pay | Admitting: Oncology

## 2012-08-16 LAB — CBC CANCER CENTER
Basophil %: 0.7 %
Eosinophil #: 0.2 x10 3/mm (ref 0.0–0.7)
Eosinophil %: 2.7 %
HCT: 44.5 % (ref 40.0–52.0)
HGB: 15.1 g/dL (ref 13.0–18.0)
Lymphocyte #: 1.6 x10 3/mm (ref 1.0–3.6)
Lymphocyte %: 19.5 %
Monocyte %: 7.9 %
Neutrophil #: 5.8 x10 3/mm (ref 1.4–6.5)
WBC: 8.4 x10 3/mm (ref 3.8–10.6)

## 2012-08-16 LAB — COMPREHENSIVE METABOLIC PANEL
Albumin: 3.7 g/dL (ref 3.4–5.0)
Alkaline Phosphatase: 91 U/L (ref 50–136)
BUN: 15 mg/dL (ref 7–18)
Bilirubin,Total: 0.3 mg/dL (ref 0.2–1.0)
Calcium, Total: 8.7 mg/dL (ref 8.5–10.1)
Co2: 31 mmol/L (ref 21–32)
Creatinine: 1.37 mg/dL — ABNORMAL HIGH (ref 0.60–1.30)
EGFR (African American): >60
EGFR (Non-African Amer.): 54 — ABNORMAL LOW
Glucose: 120 mg/dL — ABNORMAL HIGH (ref 65–99)
Osmolality: 278 (ref 275–301)
Potassium: 5.2 mmol/L — ABNORMAL HIGH (ref 3.5–5.1)
Sodium: 138 mmol/L (ref 136–145)
Total Protein: 6.8 g/dL (ref 6.4–8.2)

## 2012-08-23 ENCOUNTER — Telehealth: Payer: Self-pay

## 2012-08-23 NOTE — Telephone Encounter (Addendum)
Prior auth needed on Diovan; waiting for PA forms. PA form Dr Daphine Deutscher in box for completion.

## 2012-08-27 NOTE — Telephone Encounter (Signed)
Pt request status of refill Diovan; pt request call back [phone].

## 2012-08-27 NOTE — Telephone Encounter (Signed)
Patient advised waiting on prior authorization

## 2012-08-27 NOTE — Telephone Encounter (Signed)
Completed form faxed [phone].

## 2012-09-03 NOTE — Telephone Encounter (Signed)
[employer] denied prior auth for Diovan. Denial letter is on Dr Daphine Deutscher desk. Pt also left v/m requesting update on Diovan; call [phone].

## 2012-09-04 MED ORDER — LOSARTAN POTASSIUM 100 MG PO TABS: 100.0000 mg | ORAL_TABLET | Freq: Every day | ORAL | Status: DC

## 2012-09-04 NOTE — Telephone Encounter (Signed)
Patient advised.

## 2012-09-04 NOTE — Telephone Encounter (Signed)
Call pt.Marland Kitchen Has he tried losartan in apst.. Is he willing to change to this?

## 2012-09-04 NOTE — Telephone Encounter (Signed)
Already spoke with patient he is fine with change and hasnt tried any on that list of meds

## 2012-09-04 NOTE — Telephone Encounter (Signed)
No pharmacy listed.. Please send in. Have pt follow [facility] at home in next 1-2 weeks to make sure dose adequate.

## 2012-09-04 NOTE — Addendum Note (Signed)
Addended by: Kerby Nora E on: 09/04/2012 08:22 AM   Modules accepted: Orders

## 2012-09-04 NOTE — Addendum Note (Signed)
Addended by: Consuello Masse on: 09/04/2012 08:27 AM   Modules accepted: Orders

## 2012-09-08 ENCOUNTER — Ambulatory Visit: Payer: Self-pay | Admitting: Oncology

## 2012-09-25 ENCOUNTER — Other Ambulatory Visit: Payer: Self-pay | Admitting: Orthopedic Surgery

## 2012-09-25 DIAGNOSIS — R52 Pain, unspecified: Secondary | ICD-10-CM

## 2012-09-30 ENCOUNTER — Ambulatory Visit
Admission: RE | Admit: 2012-09-30 | Discharge: 2012-09-30 | Disposition: A | Discharge status: Home / Self Care | Payer: PRIVATE HEALTH INSURANCE | Source: Ambulatory Visit | Attending: Orthopedic Surgery | Admitting: Orthopedic Surgery

## 2012-09-30 DIAGNOSIS — R52 Pain, unspecified: Secondary | ICD-10-CM

## 2012-11-16 ENCOUNTER — Other Ambulatory Visit: Payer: Self-pay | Admitting: Neurosurgery

## 2012-11-16 DIAGNOSIS — M48061 Spinal stenosis, lumbar region without neurogenic claudication: Secondary | ICD-10-CM

## 2012-11-27 ENCOUNTER — Ambulatory Visit
Admission: RE | Admit: 2012-11-27 | Discharge: 2012-11-27 | Disposition: A | Discharge status: Home / Self Care | Payer: BC Managed Care – PPO | Source: Ambulatory Visit | Attending: Neurosurgery | Admitting: Neurosurgery

## 2012-11-27 DIAGNOSIS — M48061 Spinal stenosis, lumbar region without neurogenic claudication: Secondary | ICD-10-CM

## 2012-11-27 MED ORDER — GADOBENATE DIMEGLUMINE 529 MG/ML IV SOLN
15.0000 mL | Freq: Once | INTRAVENOUS | Status: AC | PRN
  Administered 2012-11-27: 15 mL via INTRAVENOUS

## 2013-01-16 ENCOUNTER — Other Ambulatory Visit: Payer: Self-pay | Admitting: Family Medicine

## 2013-02-07 ENCOUNTER — Other Ambulatory Visit: Payer: Self-pay | Admitting: Neurosurgery

## 2013-02-11 ENCOUNTER — Encounter (HOSPITAL_COMMUNITY): Payer: Self-pay | Admitting: Pharmacy Technician

## 2013-02-12 ENCOUNTER — Encounter (HOSPITAL_COMMUNITY)
Admission: RE | Admit: 2013-02-12 | Discharge: 2013-02-12 | Disposition: A | Discharge status: Home / Self Care | Payer: Medicare Other | Source: Ambulatory Visit | Attending: Neurosurgery | Admitting: Neurosurgery

## 2013-02-12 ENCOUNTER — Encounter (HOSPITAL_COMMUNITY): Payer: Self-pay

## 2013-02-12 HISTORY — DX: Attention-deficit hyperactivity disorder, unspecified type: F90.9

## 2013-02-12 HISTORY — DX: Sleep apnea, unspecified: G47.30

## 2013-02-12 HISTORY — DX: Unspecified glaucoma: H40.9

## 2013-02-12 HISTORY — DX: Personal history of other diseases of the digestive system: Z87.19

## 2013-02-12 HISTORY — DX: Shortness of breath: R06.02

## 2013-02-12 HISTORY — DX: Family history of other specified conditions: Z84.89

## 2013-02-12 LAB — BASIC METABOLIC PANEL
BUN: 17 mg/dL (ref 6–23)
CO2: 29 mEq/L (ref 19–32)
Chloride: 103 mEq/L (ref 96–112)
Creatinine, Ser: 1.36 mg/dL — ABNORMAL HIGH (ref 0.50–1.35)
GFR calc Af Amer: 61 mL/min — ABNORMAL LOW (ref >90)
Glucose, Bld: 137 mg/dL — ABNORMAL HIGH (ref 70–99)
Potassium: 5.7 mEq/L — ABNORMAL HIGH (ref 3.5–5.1)

## 2013-02-12 LAB — CBC WITH DIFFERENTIAL/PLATELET
Basophils Relative: 1 % (ref 0–1)
Eosinophils Relative: 3 % (ref 0–5)
HCT: 41.5 % (ref 39.0–52.0)
Hemoglobin: 13.7 g/dL (ref 13.0–17.0)
MCH: 33.6 pg (ref 26.0–34.0)
MCHC: 33 g/dL (ref 30.0–36.0)
MCV: 101.7 fL — ABNORMAL HIGH (ref 78.0–100.0)
Monocytes Absolute: 0.7 10*3/uL (ref 0.1–1.0)
Monocytes Relative: 10 % (ref 3–12)
Neutro Abs: 4.4 10*3/uL (ref 1.7–7.7)
RDW: 13.3 % (ref 11.5–15.5)

## 2013-02-12 NOTE — Progress Notes (Signed)
Patient notified of positive staph, verified pharmacy.  Notified Dr. Lindalou Hose office of positive PCR of staph and requested that they call in rx for Mupirocin to [facility] on [address] for patient.  Spoke with Darl Pikes.

## 2013-02-12 NOTE — Pre-Procedure Instructions (Signed)
Pedro [name]  02/12/2013   Your procedure is scheduled on:  February 14, 2013 at 7:30am  Report to [facility] at 5:30 AM.  Call this number if you have problems the morning of surgery: [phone]   Remember:   Do not eat food or drink liquids after midnight.   Take these medicines the morning of surgery with A SIP OF WATER: amLODipine (NORVASC), cyclobenzaprine (FLEXERIL), HYDROcodone-acetaminophen (NORCO/VICODIN),                    You may wear deodorant.  Do not shave 48 hours prior to surgery. Men may shave face and neck.  Do not bring valuables to the hospital.  [facility] is not responsible                   for any belongings or valuables.  Contacts, dentures or bridgework may not be worn into surgery.  Leave suitcase in the car. After surgery it may be brought to your room.  For patients admitted to the hospital, checkout time is 11:00 AM the day of  discharge.   Special Instructions: Shower using CHG 2 nights before surgery and the night before surgery.  If you shower the day of surgery use CHG.  Use special wash - you have one bottle of CHG for all showers.  You should use approximately 1/3 of the bottle for each shower.   Please read over the following fact sheets that you were given: Pain Booklet, Coughing and Deep Breathing and Surgical Site Infection Prevention

## 2013-02-13 MED ORDER — CEFAZOLIN SODIUM-DEXTROSE 2-3 GM-% IV SOLR
2.0000 g | INTRAVENOUS | Status: AC
  Administered 2013-02-14: 2 g via INTRAVENOUS
  Filled 2013-02-13: qty 50

## 2013-02-13 MED ORDER — DEXAMETHASONE SODIUM PHOSPHATE 10 MG/ML IJ SOLN
10.0000 mg | INTRAMUSCULAR | Status: AC
  Administered 2013-02-14: 10 mg via INTRAVENOUS
  Filled 2013-02-13: qty 1

## 2013-02-13 NOTE — Progress Notes (Signed)
Anesthesia chart review: Patient is a 67 year old male scheduled for right L4-5 extraforaminal decompression/laminectomy on 02/14/2013 by Dr. Jordan Likes.  History includes former smoker, ADD, ETOH abuse now sober, depression, COPD, PNA, anxiety hypercholesterolemia, insomnia, renal cell cancer s/p nephrectomy, OA, HTN, GERD, prior neck fusion and back surgery. He is s/p L2-3, L3-4 decompressive laminectomy/foraminotomies on 03/12/12.  PCP is Dr. Kerby Nora.   CXR on 03/08/12 showed: The cardiac silhouette, mediastinal and hilar contours are stable. The lungs are clear of acute process. Stable basilar scarring changes. Stable rounded density at the right lung base.   EKG on 03/08/12 showed NSR.   Lab reviewed. His BUN 17, Cr 1.36.  K 5.7 (previously 4.6-5.2 in 2013). Glucose is 137. H/H 13.7/41.5. PLT 233.   Meds include amlodipine, amitriptyline, Dalmane, Norco, Flexeril, losartan, Ritalin.   I called K+ results to Darl Pikes at Dr. Lindalou Hose office.  I will order an ISTAT4 for the day of surgery to ensure his hyperkalemia has not worsened.  If result remains < 6.0 then I would anticipate that he could proceed as planned.  Velna Ochs [facility]/Anesthesiology Phone [phone] 02/13/2013 9:28 AM

## 2013-02-14 ENCOUNTER — Encounter (HOSPITAL_COMMUNITY): Payer: Self-pay | Admitting: Vascular Surgery

## 2013-02-14 ENCOUNTER — Encounter (HOSPITAL_COMMUNITY): Payer: Self-pay | Admitting: *Deleted

## 2013-02-14 ENCOUNTER — Inpatient Hospital Stay (HOSPITAL_COMMUNITY): Payer: Medicare Other

## 2013-02-14 ENCOUNTER — Inpatient Hospital Stay (HOSPITAL_COMMUNITY): Payer: Medicare Other | Admitting: Anesthesiology

## 2013-02-14 ENCOUNTER — Encounter (HOSPITAL_COMMUNITY)
Admission: RE | Disposition: A | Discharge status: Home / Self Care | Payer: Self-pay | Source: Ambulatory Visit | Attending: Neurosurgery

## 2013-02-14 ENCOUNTER — Observation Stay (HOSPITAL_COMMUNITY)
Admission: RE | Admit: 2013-02-14 | Discharge: 2013-02-14 | Disposition: A | Discharge status: Home / Self Care | Payer: Medicare Other | Source: Ambulatory Visit | Attending: Neurosurgery | Admitting: Neurosurgery

## 2013-02-14 DIAGNOSIS — Z01812 Encounter for preprocedural laboratory examination: Secondary | ICD-10-CM | POA: Insufficient documentation

## 2013-02-14 DIAGNOSIS — J449 Chronic obstructive pulmonary disease, unspecified: Secondary | ICD-10-CM | POA: Insufficient documentation

## 2013-02-14 DIAGNOSIS — I10 Essential (primary) hypertension: Secondary | ICD-10-CM | POA: Insufficient documentation

## 2013-02-14 DIAGNOSIS — M47817 Spondylosis without myelopathy or radiculopathy, lumbosacral region: Principal | ICD-10-CM | POA: Diagnosis present

## 2013-02-14 DIAGNOSIS — J4489 Other specified chronic obstructive pulmonary disease: Secondary | ICD-10-CM | POA: Insufficient documentation

## 2013-02-14 HISTORY — PX: LUMBAR LAMINECTOMY/DECOMPRESSION MICRODISCECTOMY: SHX5026

## 2013-02-14 LAB — POCT I-STAT 4, (NA,K, GLUC, HGB,HCT): Sodium: 139 mEq/L (ref 135–145)

## 2013-02-14 SURGERY — LUMBAR LAMINECTOMY/DECOMPRESSION MICRODISCECTOMY 1 LEVEL
Anesthesia: General | Site: Back | Laterality: Right | Wound class: Clean

## 2013-02-14 MED ORDER — SODIUM CHLORIDE 0.9 % IV SOLN: 250.0000 mL | INTRAVENOUS | Status: DC

## 2013-02-14 MED ORDER — PROMETHAZINE HCL 25 MG/ML IJ SOLN: 6.2500 mg | INTRAMUSCULAR | Status: DC | PRN

## 2013-02-14 MED ORDER — ALUM & MAG HYDROXIDE-SIMETH 200-200-20 MG/5ML PO SUSP: 30.0000 mL | Freq: Four times a day (QID) | ORAL | Status: DC | PRN

## 2013-02-14 MED ORDER — HYDROMORPHONE HCL PF 1 MG/ML IJ SOLN: 0.5000 mg | INTRAMUSCULAR | Status: DC | PRN

## 2013-02-14 MED ORDER — METHYLPHENIDATE HCL 5 MG PO TABS: 20.0000 mg | ORAL_TABLET | Freq: Three times a day (TID) | ORAL | Status: DC

## 2013-02-14 MED ORDER — FENTANYL CITRATE 0.05 MG/ML IJ SOLN
INTRAMUSCULAR | Status: DC | PRN
  Administered 2013-02-14: 50 ug via INTRAVENOUS
  Administered 2013-02-14: 100 ug via INTRAVENOUS

## 2013-02-14 MED ORDER — MIDAZOLAM HCL 5 MG/5ML IJ SOLN
INTRAMUSCULAR | Status: DC | PRN
  Administered 2013-02-14: 2 mg via INTRAVENOUS

## 2013-02-14 MED ORDER — PROPOFOL 10 MG/ML IV BOLUS
INTRAVENOUS | Status: DC | PRN
  Administered 2013-02-14: 150 mg via INTRAVENOUS

## 2013-02-14 MED ORDER — KETOROLAC TROMETHAMINE 30 MG/ML IJ SOLN
30.0000 mg | Freq: Four times a day (QID) | INTRAMUSCULAR | Status: DC
  Administered 2013-02-14: 30 mg via INTRAVENOUS

## 2013-02-14 MED ORDER — LOSARTAN POTASSIUM 50 MG PO TABS: 100.0000 mg | ORAL_TABLET | Freq: Every day | ORAL | Status: DC

## 2013-02-14 MED ORDER — AMLODIPINE BESYLATE 5 MG PO TABS: 5.0000 mg | ORAL_TABLET | Freq: Every day | ORAL | Status: DC

## 2013-02-14 MED ORDER — PHENYLEPHRINE HCL 10 MG/ML IJ SOLN
INTRAMUSCULAR | Status: DC | PRN
  Administered 2013-02-14 (×3): 40 ug via INTRAVENOUS
  Administered 2013-02-14: 80 ug via INTRAVENOUS

## 2013-02-14 MED ORDER — CYCLOBENZAPRINE HCL 10 MG PO TABS: 10.0000 mg | ORAL_TABLET | Freq: Three times a day (TID) | ORAL | Status: DC | PRN

## 2013-02-14 MED ORDER — GLYCOPYRROLATE 0.2 MG/ML IJ SOLN
INTRAMUSCULAR | Status: DC | PRN
  Administered 2013-02-14: .4 mg via INTRAVENOUS
  Administered 2013-02-14: .8 mg via INTRAVENOUS

## 2013-02-14 MED ORDER — SODIUM CHLORIDE 0.9 % IV SOLN
INTRAVENOUS | Status: AC
  Filled 2013-02-14: qty 500

## 2013-02-14 MED ORDER — MENTHOL 3 MG MT LOZG: 1.0000 | LOZENGE | OROMUCOSAL | Status: DC | PRN

## 2013-02-14 MED ORDER — HEMOSTATIC AGENTS (NO CHARGE) OPTIME
TOPICAL | Status: DC | PRN
  Administered 2013-02-14: 1 via TOPICAL

## 2013-02-14 MED ORDER — OXYCODONE-ACETAMINOPHEN 5-325 MG PO TABS
1.0000 | ORAL_TABLET | ORAL | Status: DC | PRN
  Administered 2013-02-14: 2 via ORAL
  Filled 2013-02-14: qty 2

## 2013-02-14 MED ORDER — MIDAZOLAM HCL 2 MG/2ML IJ SOLN: 1.0000 mg | INTRAMUSCULAR | Status: DC | PRN

## 2013-02-14 MED ORDER — EPHEDRINE SULFATE 50 MG/ML IJ SOLN
INTRAMUSCULAR | Status: DC | PRN
  Administered 2013-02-14 (×2): 10 mg via INTRAVENOUS
  Administered 2013-02-14 (×2): 5 mg via INTRAVENOUS

## 2013-02-14 MED ORDER — SENNA 8.6 MG PO TABS: 1.0000 | ORAL_TABLET | Freq: Two times a day (BID) | ORAL | Status: DC

## 2013-02-14 MED ORDER — ARTIFICIAL TEARS OP OINT
TOPICAL_OINTMENT | OPHTHALMIC | Status: DC | PRN
  Administered 2013-02-14: 1 via OPHTHALMIC

## 2013-02-14 MED ORDER — ONDANSETRON HCL 4 MG/2ML IJ SOLN: 4.0000 mg | INTRAMUSCULAR | Status: DC | PRN

## 2013-02-14 MED ORDER — AMITRIPTYLINE HCL 100 MG PO TABS
100.0000 mg | ORAL_TABLET | Freq: Every day | ORAL | Status: DC
  Filled 2013-02-14: qty 1

## 2013-02-14 MED ORDER — SODIUM CHLORIDE 0.9 % IJ SOLN
3.0000 mL | Freq: Two times a day (BID) | INTRAMUSCULAR | Status: DC
  Administered 2013-02-14: 3 mL via INTRAVENOUS

## 2013-02-14 MED ORDER — PHENYLEPHRINE HCL 10 MG/ML IJ SOLN
10.0000 mg | INTRAMUSCULAR | Status: DC | PRN
  Administered 2013-02-14: 25 ug/min via INTRAVENOUS

## 2013-02-14 MED ORDER — ONDANSETRON HCL 4 MG/2ML IJ SOLN
INTRAMUSCULAR | Status: DC | PRN
  Administered 2013-02-14: 4 mg via INTRAVENOUS

## 2013-02-14 MED ORDER — FENTANYL CITRATE 0.05 MG/ML IJ SOLN: 50.0000 ug | Freq: Once | INTRAMUSCULAR | Status: DC

## 2013-02-14 MED ORDER — SODIUM CHLORIDE 0.9 % IJ SOLN: 3.0000 mL | INTRAMUSCULAR | Status: DC | PRN

## 2013-02-14 MED ORDER — NEOSTIGMINE METHYLSULFATE 1 MG/ML IJ SOLN
INTRAMUSCULAR | Status: DC | PRN
  Administered 2013-02-14: 5 mg via INTRAVENOUS

## 2013-02-14 MED ORDER — ALBUMIN HUMAN 5 % IV SOLN
INTRAVENOUS | Status: DC | PRN
  Administered 2013-02-14: 09:00:00 via INTRAVENOUS

## 2013-02-14 MED ORDER — LIDOCAINE HCL (CARDIAC) 20 MG/ML IV SOLN
INTRAVENOUS | Status: DC | PRN
  Administered 2013-02-14: 100 mg via INTRAVENOUS

## 2013-02-14 MED ORDER — ATROPINE SULFATE 0.4 MG/ML IJ SOLN
INTRAMUSCULAR | Status: DC | PRN
  Administered 2013-02-14: 0.2 mg via INTRAVENOUS
  Administered 2013-02-14: .2 mg via INTRAVENOUS

## 2013-02-14 MED ORDER — TEMAZEPAM 15 MG PO CAPS: 30.0000 mg | ORAL_CAPSULE | Freq: Every evening | ORAL | Status: DC | PRN

## 2013-02-14 MED ORDER — ACETAMINOPHEN 325 MG PO TABS: 650.0000 mg | ORAL_TABLET | ORAL | Status: DC | PRN

## 2013-02-14 MED ORDER — CEFAZOLIN SODIUM 1-5 GM-% IV SOLN
1.0000 g | Freq: Three times a day (TID) | INTRAVENOUS | Status: DC
  Administered 2013-02-14: 1 g via INTRAVENOUS
  Filled 2013-02-14 (×2): qty 50

## 2013-02-14 MED ORDER — METHYLPHENIDATE HCL 20 MG PO TABS: 20.0000 mg | ORAL_TABLET | Freq: Three times a day (TID) | ORAL | Status: DC

## 2013-02-14 MED ORDER — ACETAMINOPHEN 650 MG RE SUPP: 650.0000 mg | RECTAL | Status: DC | PRN

## 2013-02-14 MED ORDER — HYDROCODONE-ACETAMINOPHEN 5-325 MG PO TABS: 1.0000 | ORAL_TABLET | ORAL | Status: DC | PRN

## 2013-02-14 MED ORDER — SODIUM CHLORIDE 0.9 % IR SOLN
Status: DC | PRN
  Administered 2013-02-14: 08:00:00

## 2013-02-14 MED ORDER — BACITRACIN 50000 UNITS IM SOLR
INTRAMUSCULAR | Status: AC
  Filled 2013-02-14: qty 1

## 2013-02-14 MED ORDER — LACTATED RINGERS IV SOLN
INTRAVENOUS | Status: DC | PRN
  Administered 2013-02-14 (×3): via INTRAVENOUS

## 2013-02-14 MED ORDER — BUPIVACAINE HCL (PF) 0.25 % IJ SOLN
INTRAMUSCULAR | Status: DC | PRN
  Administered 2013-02-14: 20 mL

## 2013-02-14 MED ORDER — CYCLOBENZAPRINE HCL 10 MG PO TABS
10.0000 mg | ORAL_TABLET | ORAL | Status: DC
  Filled 2013-02-14: qty 1

## 2013-02-14 MED ORDER — ROCURONIUM BROMIDE 100 MG/10ML IV SOLN
INTRAVENOUS | Status: DC | PRN
  Administered 2013-02-14: 50 mg via INTRAVENOUS

## 2013-02-14 MED ORDER — OXYCODONE HCL 5 MG/5ML PO SOLN: 5.0000 mg | Freq: Once | ORAL | Status: DC | PRN

## 2013-02-14 MED ORDER — CYCLOBENZAPRINE HCL 10 MG PO TABS
ORAL_TABLET | ORAL | Status: AC
  Administered 2013-02-14: 10 mg
  Filled 2013-02-14: qty 1

## 2013-02-14 MED ORDER — OXYCODONE HCL 5 MG PO TABS: 5.0000 mg | ORAL_TABLET | Freq: Once | ORAL | Status: DC | PRN

## 2013-02-14 MED ORDER — THROMBIN 5000 UNITS EX SOLR
CUTANEOUS | Status: DC | PRN
  Administered 2013-02-14 (×2): 5000 [IU] via TOPICAL

## 2013-02-14 MED ORDER — ALBUTEROL SULFATE HFA 108 (90 BASE) MCG/ACT IN AERS
INHALATION_SPRAY | RESPIRATORY_TRACT | Status: DC | PRN
  Administered 2013-02-14 (×2): 2 via RESPIRATORY_TRACT

## 2013-02-14 MED ORDER — HYDROMORPHONE HCL PF 1 MG/ML IJ SOLN
INTRAMUSCULAR | Status: AC
  Filled 2013-02-14: qty 1

## 2013-02-14 MED ORDER — HYDROMORPHONE HCL PF 1 MG/ML IJ SOLN
0.2500 mg | INTRAMUSCULAR | Status: DC | PRN
  Administered 2013-02-14 (×2): 0.5 mg via INTRAVENOUS

## 2013-02-14 MED ORDER — 0.9 % SODIUM CHLORIDE (POUR BTL) OPTIME
TOPICAL | Status: DC | PRN
  Administered 2013-02-14: 1000 mL

## 2013-02-14 MED ORDER — PHENOL 1.4 % MT LIQD: 1.0000 | OROMUCOSAL | Status: DC | PRN

## 2013-02-14 SURGICAL SUPPLY — 52 items
BAG DECANTER FOR FLEXI CONT (MISCELLANEOUS) ×2 IMPLANT
BENZOIN TINCTURE PRP APPL 2/3 (GAUZE/BANDAGES/DRESSINGS) ×2 IMPLANT
BLADE SURG ROTATE 9660 (MISCELLANEOUS) IMPLANT
BRUSH SCRUB EZ PLAIN DRY (MISCELLANEOUS) ×2 IMPLANT
BUR CUTTER 7.0 ROUND (BURR) ×2 IMPLANT
CANISTER SUCTION 2500CC (MISCELLANEOUS) ×2 IMPLANT
CLOTH BEACON ORANGE TIMEOUT ST (SAFETY) ×2 IMPLANT
CONT SPEC 4OZ CLIKSEAL STRL BL (MISCELLANEOUS) ×2 IMPLANT
DECANTER SPIKE VIAL GLASS SM (MISCELLANEOUS) ×2 IMPLANT
DERMABOND ADHESIVE PROPEN (GAUZE/BANDAGES/DRESSINGS) ×1
DERMABOND ADVANCED (GAUZE/BANDAGES/DRESSINGS) ×1
DERMABOND ADVANCED .7 DNX12 (GAUZE/BANDAGES/DRESSINGS) ×1 IMPLANT
DERMABOND ADVANCED .7 DNX6 (GAUZE/BANDAGES/DRESSINGS) ×1 IMPLANT
DRAPE LAPAROTOMY 100X72X124 (DRAPES) ×2 IMPLANT
DRAPE MICROSCOPE ZEISS OPMI (DRAPES) ×2 IMPLANT
DRAPE POUCH INSTRU U-SHP 10X18 (DRAPES) ×2 IMPLANT
DRAPE PROXIMA HALF (DRAPES) IMPLANT
DRAPE SURG 17X23 STRL (DRAPES) ×4 IMPLANT
DURAPREP 26ML APPLICATOR (WOUND CARE) ×2 IMPLANT
ELECT REM PT RETURN 9FT ADLT (ELECTROSURGICAL) ×2
ELECTRODE REM PT RTRN 9FT ADLT (ELECTROSURGICAL) ×1 IMPLANT
GAUZE SPONGE 4X4 16PLY XRAY LF (GAUZE/BANDAGES/DRESSINGS) IMPLANT
GLOVE BIOGEL PI IND STRL 7.0 (GLOVE) ×3 IMPLANT
GLOVE BIOGEL PI INDICATOR 7.0 (GLOVE) ×3
GLOVE ECLIPSE 8.5 STRL (GLOVE) ×2 IMPLANT
GLOVE EXAM NITRILE LRG STRL (GLOVE) IMPLANT
GLOVE EXAM NITRILE MD LF STRL (GLOVE) ×2 IMPLANT
GLOVE EXAM NITRILE XL STR (GLOVE) IMPLANT
GLOVE EXAM NITRILE XS STR PU (GLOVE) IMPLANT
GLOVE SURG SS PI 7.0 STRL IVOR (GLOVE) ×6 IMPLANT
GOWN BRE IMP SLV AUR LG STRL (GOWN DISPOSABLE) IMPLANT
GOWN BRE IMP SLV AUR XL STRL (GOWN DISPOSABLE) ×6 IMPLANT
GOWN STRL REIN 2XL LVL4 (GOWN DISPOSABLE) IMPLANT
KIT BASIN OR (CUSTOM PROCEDURE TRAY) ×2 IMPLANT
KIT ROOM TURNOVER OR (KITS) ×2 IMPLANT
NEEDLE HYPO 22GX1.5 SAFETY (NEEDLE) ×2 IMPLANT
NEEDLE SPNL 22GX3.5 QUINCKE BK (NEEDLE) ×2 IMPLANT
NS IRRIG 1000ML POUR BTL (IV SOLUTION) ×2 IMPLANT
PACK LAMINECTOMY NEURO (CUSTOM PROCEDURE TRAY) ×2 IMPLANT
PAD ARMBOARD 7.5X6 YLW CONV (MISCELLANEOUS) ×6 IMPLANT
RUBBERBAND STERILE (MISCELLANEOUS) ×4 IMPLANT
SPONGE GAUZE 4X4 12PLY (GAUZE/BANDAGES/DRESSINGS) ×2 IMPLANT
SPONGE SURGIFOAM ABS GEL SZ50 (HEMOSTASIS) ×2 IMPLANT
STRIP CLOSURE SKIN 1/2X4 (GAUZE/BANDAGES/DRESSINGS) ×2 IMPLANT
SUT VIC AB 2-0 CT1 18 (SUTURE) ×2 IMPLANT
SUT VIC AB 3-0 SH 8-18 (SUTURE) ×2 IMPLANT
SYR 20ML ECCENTRIC (SYRINGE) ×2 IMPLANT
TAPE CLOTH SURG 4X10 WHT LF (GAUZE/BANDAGES/DRESSINGS) ×2 IMPLANT
TAPE STRIPS DRAPE STRL (GAUZE/BANDAGES/DRESSINGS) ×2 IMPLANT
TOWEL OR 17X24 6PK STRL BLUE (TOWEL DISPOSABLE) ×2 IMPLANT
TOWEL OR 17X26 10 PK STRL BLUE (TOWEL DISPOSABLE) ×2 IMPLANT
WATER STERILE IRR 1000ML POUR (IV SOLUTION) ×2 IMPLANT

## 2013-02-14 NOTE — Progress Notes (Signed)
Orthopedic Tech Progress Note Patient Details:  MICHEAL MURAD 09-20-1945 [id number] Brace order completed by bio-tech vendor. Patient ID: BRONSON BRESSMAN, male   DOB: June 13, 1946, 67 y.o.   MRN: [id number]   Jennye Moccasin 02/14/2013, 4:07 PM

## 2013-02-14 NOTE — Anesthesia Procedure Notes (Signed)
Procedure Name: Intubation Date/Time: 02/14/2013 7:56 AM Performed by: Sherie Don Pre-anesthesia Checklist: Patient identified, Emergency Drugs available, Suction available, Patient being monitored and Timeout performed Patient Re-evaluated:Patient Re-evaluated prior to inductionOxygen Delivery Method: Circle system utilized Preoxygenation: Pre-oxygenation with 100% oxygen Intubation Type: IV induction Ventilation: Mask ventilation without difficulty Laryngoscope Size: Mac and 3 Grade View: Grade II Tube size: 8.0 mm Number of attempts: 1 Airway Equipment and Method: Stylet Placement Confirmation: ETT inserted through vocal cords under direct vision,  positive ETCO2 and breath sounds checked- equal and bilateral Secured at: 23 cm Tube secured with: Tape Dental Injury: Teeth and Oropharynx as per pre-operative assessment

## 2013-02-14 NOTE — Anesthesia Preprocedure Evaluation (Signed)
Anesthesia Evaluation  Patient identified by MRN, date of birth, ID band Patient awake    Reviewed: Allergy & Precautions, H&P , NPO status , Patient's Chart, lab work & pertinent test results  Airway Mallampati: II TM Distance: >3 FB Neck ROM: Full    Dental  (+) Missing, Poor Dentition and Dental Advisory Given   Pulmonary shortness of breath, sleep apnea , pneumonia -, resolved, COPDformer smoker,  + rhonchi         Cardiovascular hypertension, Pt. on medications Rhythm:Regular Rate:Normal     Neuro/Psych Anxiety Depression    GI/Hepatic Neg liver ROS, hiatal hernia, GERD-  ,(+)     substance abuse  alcohol use,   Endo/Other  negative endocrine ROS  Renal/GU History of Right Nephrectomy.     Musculoskeletal   Abdominal   Peds  Hematology   Anesthesia Other Findings   Reproductive/Obstetrics                           Anesthesia Physical Anesthesia Plan  ASA: III  Anesthesia Plan: General   Post-op Pain Management:    Induction: Intravenous  Airway Management Planned: Oral ETT  Additional Equipment:   Intra-op Plan:   Post-operative Plan: Extubation in OR  Informed Consent: I have reviewed the patients History and Physical, chart, labs and discussed the procedure including the risks, benefits and alternatives for the proposed anesthesia with the patient or authorized representative who has indicated his/her understanding and acceptance.     Plan Discussed with: CRNA and Surgeon  Anesthesia Plan Comments:         Anesthesia Quick Evaluation

## 2013-02-14 NOTE — H&P (Signed)
Pedro [name] is an 67 y.o. male.   Chief Complaint: Right leg pain HPI: 67 year old male with right L4 and L5 radicular pain failing conservative management. Workup demonstrates evidence of marked spondylosis with lateral he says and severe foraminal stenosis at L4-5 on the right. Patient has history of previous multilevel decompressive surgery and L2-3 and 4. He presents now for right-sided L4-5 laminotomy and foraminotomies and right-sided L4-5 extraforaminal decompression.  Past Medical History  Diagnosis Date  . ADD (attention deficit disorder)   . Alcohol abuse, in remission   . Allergic rhinitis, cause unspecified   . Chronic airway obstruction, not elsewhere classified   . Cough   . Depressive disorder, not elsewhere classified   . Diverticulitis   . GERD (gastroesophageal reflux disease)   . Pure hypercholesterolemia   . Unspecified essential hypertension   . Insomnia   . Osteoarthritis     hands  . Routine general medical examination at a health care facility   . Special screening for malignant neoplasm of prostate   . Tobacco use disorder   . Pneumonia     As a child  . Anxiety   . Constipation   . Family history of anesthesia complication   . ADHD (attention deficit hyperactivity disorder)     diagnosed around 10 years ago  . Glaucoma 2012    pressure reduced after cataract surgery  . Shortness of breath     with exertion  . Sleep apnea   . Renal cell cancer     right kidney removed  . H/O hiatal hernia     15-20 years    Past Surgical History  Procedure Laterality Date  . Neck fusion    . Inguinal hernia repair      bilateral  . Appendectomy    . Nephrectomy  2010    Right kidney for renal cell cancer  . Tonsillectomy    . Back surgery      Partial diskectomy  . Lumbar laminectomy/decompression microdiscectomy  03/12/2012    Procedure: LUMBAR LAMINECTOMY/DECOMPRESSION MICRODISCECTOMY 2 LEVELS;  Surgeon: Temple Pacini, MD;  Location: [facility];  Service:  Neurosurgery;  Laterality: Bilateral;  Lumbar two-three, three-four decompressive lumbar laminectomy  . Eye surgery      cataract surgery x 2  . Colonoscopy  2011    Family History  Problem Relation Age of Onset  . Cancer Mother     lung  . Hyperlipidemia Mother   . Cancer Father     lung  . Hyperlipidemia Father   . Coronary artery disease Brother   . Heart attack Brother     age 24   Social History:  reports that he quit smoking about 3 weeks ago. His smoking use included Cigarettes. He has a 40 pack-year smoking history. His smokeless tobacco use includes Chew. He reports that he does not drink alcohol or use illicit drugs.  Allergies:  Allergies  Allergen Reactions  . Bee Venom Anaphylaxis    Unknown    Medications Prior to Admission  Medication Sig Dispense Refill  . amitriptyline (ELAVIL) 50 MG tablet Take 100 mg by mouth at bedtime.       Marland Kitchen amLODipine (NORVASC) 5 MG tablet Take 5 mg by mouth daily.      . cyclobenzaprine (FLEXERIL) 10 MG tablet Take 10 mg by mouth daily as needed for muscle spasms.      . flurazepam (DALMANE) 30 MG capsule Take 30 mg by mouth at bedtime.       Marland Kitchen  HYDROcodone-acetaminophen (NORCO/VICODIN) 5-325 MG per tablet Take 1 tablet by mouth every 4 (four) hours as needed for pain.      Marland Kitchen losartan (COZAAR) 100 MG tablet Take 1 tablet (100 mg total) by mouth daily.  90 tablet  3  . methylphenidate (RITALIN) 20 MG tablet Take 20 mg by mouth 3 (three) times daily.        Results for orders placed during the hospital encounter of 02/14/13 (from the past 48 hour(s))  POCT I-STAT 4, (NA,K, GLUC, HGB,HCT)     Status: None   Collection Time    02/14/13  6:29 AM      Result Value Range   Sodium 139  135 - 145 mEq/L   Potassium 4.2  3.5 - 5.1 mEq/L   Glucose, Bld 98  70 - 99 mg/dL   HCT 21.3  08.6 - 57.8 %   Hemoglobin 14.6  13.0 - 17.0 g/dL   No results found.  Review of Systems  Constitutional: Negative.   HENT: Negative.   Eyes: Negative.    Respiratory: Negative.   Cardiovascular: Negative.   Gastrointestinal: Negative.   Genitourinary: Negative.   Musculoskeletal: Negative.   Skin: Negative.   Neurological: Negative.   Endo/Heme/Allergies: Negative.   Psychiatric/Behavioral: Negative.     Blood pressure 98/57, pulse 90, temperature 98.2 F (36.8 C), temperature source Oral, resp. rate 18, SpO2 93.00%. Physical Exam  Constitutional: He appears well-developed and well-nourished. No distress.  HENT:  Head: Normocephalic and atraumatic.  Right Ear: External ear normal.  Left Ear: External ear normal.  Nose: Nose normal.  Mouth/Throat: Oropharynx is clear and moist.  Eyes: Conjunctivae and EOM are normal. Pupils are equal, round, and reactive to light. Right eye exhibits no discharge. Left eye exhibits no discharge.  Neck: Normal range of motion. Neck supple. No tracheal deviation present. No thyromegaly present.  Cardiovascular: Normal rate, regular rhythm, normal heart sounds and intact distal pulses.  Exam reveals no friction rub.   No murmur heard. Respiratory: Effort normal and breath sounds normal. No respiratory distress. He has no wheezes.  GI: Soft. Bowel sounds are normal. He exhibits no distension. There is no tenderness.  Musculoskeletal: Normal range of motion. He exhibits no edema and no tenderness.  Neurological: He is alert. He has normal reflexes. He displays normal reflexes. No cranial nerve deficit. He exhibits normal muscle tone. Coordination normal.  Skin: Skin is warm and dry. No rash noted. He is not diaphoretic. No erythema. No pallor.  Psychiatric: He has a normal mood and affect. His behavior is normal. Judgment and thought content normal.     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 Plan right L4-5 decompressive laminotomy and foraminotomy and right L4-5 extraforaminal decompression. Risks and benefits have been explained. Patient wishes to proceed.  Pedro [name]  A 02/14/2013, 7:43 AM

## 2013-02-14 NOTE — Transfer of Care (Signed)
Immediate Anesthesia Transfer of Care Note  Patient: Pedro [name]  Procedure(s) Performed: Procedure(s) with comments: LUMBAR LAMINECTOMY/DECOMPRESSION MICRODISCECTOMY 1 LEVEL (Right) - LUMBAR LAMINECTOMY/DECOMPRESSION MICRODISCECTOMY 1 LEVEL  Patient Location: PACU  Anesthesia Type:General  Level of Consciousness: awake and patient cooperative  Airway & Oxygen Therapy: Patient Spontanous Breathing and Patient connected to face mask oxygen  Post-op Assessment: Report given to PACU RN, Post -op Vital signs reviewed and stable and Patient moving all extremities X 4  Post vital signs: Reviewed and stable  Complications: No apparent anesthesia complications

## 2013-02-14 NOTE — Brief Op Note (Signed)
02/14/2013  9:39 AM  PATIENT:  Marland Kitchen  67 y.o. male  PRE-OPERATIVE DIAGNOSIS:  stenosis  POST-OPERATIVE DIAGNOSIS:  stenosis  PROCEDURE:  Procedure(s) with comments: LUMBAR LAMINECTOMY/DECOMPRESSION MICRODISCECTOMY 1 LEVEL (Right) - LUMBAR LAMINECTOMY/DECOMPRESSION MICRODISCECTOMY 1 LEVEL  SURGEON:  Surgeon(s) and Role:    * Temple Pacini, MD - Primary    * Hewitt Shorts, MD - Assisting  PHYSICIAN ASSISTANT:   ASSISTANTS:    ANESTHESIA:   general  EBL:  Total I/O In: 2950 [I.V.:2700; IV Piggyback:250] Out: 100 [Blood:100]  BLOOD ADMINISTERED:none  DRAINS: none   LOCAL MEDICATIONS USED:  MARCAINE     SPECIMEN:  No Specimen  DISPOSITION OF SPECIMEN:  N/A  COUNTS:  YES  TOURNIQUET:  * No tourniquets in log *  DICTATION: .Dragon Dictation  PLAN OF CARE: Admit for overnight observation  PATIENT DISPOSITION:  PACU - hemodynamically stable.   Delay start of Pharmacological VTE agent (>24hrs) due to surgical blood loss or risk of bleeding: yes

## 2013-02-14 NOTE — Discharge Summary (Signed)
Physician Discharge Summary  Patient ID: HEMAN QUE MRN: [id number] DOB/AGE: [birth year] 67 y.o.  Admit date: 02/14/2013 Discharge date: 02/14/2013  Admission Diagnoses:  Discharge Diagnoses:  Active Problems:   Lumbosacral spondylosis without myelopathy   Discharged Condition: good  Hospital Course: Patient admitted to the hospital where he underwent an uncomplicated L4-5 intra and extraforaminal decompression. Postoperatively he is done well. Back and lower extremity pain improved. Ready for discharge home.  Consults:   Significant Diagnostic Studies:   Treatments:   Discharge Exam: Blood pressure 111/66, pulse 110, temperature 98.5 F (36.9 C), temperature source Oral, resp. rate 18, SpO2 92.00%. Awake and alert. Oriented and appropriate. Cranial nerve function intact. Motor and sensory function of the extremities normal. Wound clean and dry.  Disposition: 01-Home or Self Care     Medication List         amitriptyline 50 MG tablet  Commonly known as:  ELAVIL  Take 100 mg by mouth at bedtime.     amLODipine 5 MG tablet  Commonly known as:  NORVASC  Take 5 mg by mouth daily.     cyclobenzaprine 10 MG tablet  Commonly known as:  FLEXERIL  Take 1 tablet (10 mg total) by mouth 3 (three) times daily as needed for muscle spasms.     cyclobenzaprine 10 MG tablet  Commonly known as:  FLEXERIL  Take 10 mg by mouth daily as needed for muscle spasms.     flurazepam 30 MG capsule  Commonly known as:  DALMANE  Take 30 mg by mouth at bedtime.     HYDROcodone-acetaminophen 5-325 MG per tablet  Commonly known as:  NORCO/VICODIN  Take 1-2 tablets by mouth every 4 (four) hours as needed.     losartan 100 MG tablet  Commonly known as:  COZAAR  Take 1 tablet (100 mg total) by mouth daily.     methylphenidate 20 MG tablet  Commonly known as:  RITALIN  Take 20 mg by mouth 3 (three) times daily.           Follow-up Information   Follow up with Trase Bunda A, MD In 1  week. (ext 212)    Contact information:   [address] [employer] [place] [address] [phone]       Signed: Temple Pacini 02/14/2013, 6:40 PM

## 2013-02-14 NOTE — Preoperative (Signed)
Beta Blockers   Reason not to administer Beta Blockers:Not Applicable 

## 2013-02-14 NOTE — Op Note (Signed)
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  Postoperative diagnosis: Same  Procedure Name: Right L4-5 decompressive laminotomy with foraminotomies of the proximal right L4 nerve root and L5 nerve root.  Right L4-5 extraforaminal decompression for decompression of the exiting distal L4 nerve root.  Microdissection  Surgeon:Red Mandt A.Raygan Skarda, M.D.  Asst. Surgeon: Newell Coral  Anesthesia: General  Indication: 67 year old male status post previous decompressive surgery and L2-3 and L3-4 presents now with worsening right lower extremity radicular pain consistent with a right-sided L4 and L5 radiculopathy. Workup demonstrates evidence of extreme spondylosis with critical foraminal stenosis on the right side causing marked compression of the right L4 nerve root and right L5 nerve root and the lateral he says. Patient presents now for decompressive surgery in hopes of improving his symptoms.  Operative note: After next of anesthesia, patient fashion prone onto Wilson frame and appropriately padded. Lumbar region prepped and draped sterilely. 10 please make a linear skin is in a right L4-5 interspace. This carried down sharply in the midline. Supper off dissection then performed the right side was the lamina facet joints and transverse processes of L4 and L5. Deep self-retaining traction placed intraoperative fluoroscopy R. x-ray was used and the level was confirmed. Laminotomies then performed using high-speed drill and Kerrison rongeurs to median tear aspect of lamina of L4 medial aspect the L4-5 facet joint and the superior rim of the L5 lamina. Ligament flavum was elevated and resected piecemeal fashion. Underlying thecal sac was identified. Wide decompressive foraminotomies form of course exiting L4 and L5 nerve roots by undercutting the facets and  resecting osteophytes and ligament flavum. After the proximal L4 nerve root and the L5 nerve root were decompressed the patient and placed the extraforaminal space. The superior aspect of the facet of L5 was removed using a high-speed drill and Kerrison rongeurs. Intertransverse ligament was elevated and resected. The superior facet of L5 was then undercut. The L4 nerve root was identified as it was exiting the foramen. The superior facet was resected. A generous foraminotomy was then performed along the course the entire L4 nerve root. At this point a very thorough decompression achieved. There is no his injury to thecal sac or nerve roots. Wound is then irrigated out like solution. Gelfoam was placed topically for hemostasis and began. Microscope and retractor system were removed. Hemostasis of the muscle was obtained with the electrocautery. Wound was closed close in layers with Vicryl sutures. Steri-Strips and sterile dressing were applied. There were no apparent complications. Patient tolerated the procedure well and he returns to the recovery room postop.

## 2013-02-14 NOTE — Anesthesia Postprocedure Evaluation (Signed)
  Anesthesia Post-op Note  Patient: Pedro [name]  Procedure(s) Performed: Procedure(s) with comments: LUMBAR LAMINECTOMY/DECOMPRESSION MICRODISCECTOMY 1 LEVEL (Right) - LUMBAR LAMINECTOMY/DECOMPRESSION MICRODISCECTOMY 1 LEVEL  Patient Location: PACU  Anesthesia Type:General  Level of Consciousness: awake  Airway and Oxygen Therapy: Patient Spontanous Breathing  Post-op Pain: mild  Post-op Assessment: Post-op Vital signs reviewed, Patient's Cardiovascular Status Stable, Respiratory Function Stable, Patent Airway, No signs of Nausea or vomiting and Pain level controlled  Post-op Vital Signs: stable  Complications: No apparent anesthesia complications

## 2013-02-14 NOTE — Plan of Care (Signed)
Problem: Consults Goal: Diagnosis - Spinal Surgery Outcome: Completed/Met Date Met:  02/14/13 Microdiscectomy     

## 2013-02-14 NOTE — Progress Notes (Signed)
Pt. Alert and oriented,follows simple instructions, denies pain. Incision area without swelling, redness or S/S of infection. Voiding adequate clear yellow urine. Moving all extremities well and vitals stable and documented. Lumbar surgery notes instructions given to patient and family member for home safety and precautions. Pt. and family stated understanding of instructions given.  

## 2013-02-19 ENCOUNTER — Encounter (HOSPITAL_COMMUNITY): Payer: Self-pay | Admitting: Neurosurgery

## 2013-02-19 ENCOUNTER — Other Ambulatory Visit: Payer: Self-pay | Admitting: Family Medicine

## 2013-03-18 ENCOUNTER — Other Ambulatory Visit: Payer: Self-pay | Admitting: Family Medicine

## 2013-04-16 ENCOUNTER — Other Ambulatory Visit: Payer: Self-pay

## 2013-04-16 MED ORDER — AMLODIPINE BESYLATE 5 MG PO TABS: ORAL_TABLET | ORAL | Status: DC

## 2013-04-16 NOTE — Telephone Encounter (Signed)
Pt request refill amlodipine to [facility]. Pt scheduled CPX 05/31/13.advised pt done.

## 2013-05-24 ENCOUNTER — Telehealth: Payer: Self-pay | Admitting: Family Medicine

## 2013-05-24 ENCOUNTER — Other Ambulatory Visit (INDEPENDENT_AMBULATORY_CARE_PROVIDER_SITE_OTHER): Payer: Medicare Other

## 2013-05-24 DIAGNOSIS — I10 Essential (primary) hypertension: Secondary | ICD-10-CM

## 2013-05-24 DIAGNOSIS — R972 Elevated prostate specific antigen [PSA]: Secondary | ICD-10-CM

## 2013-05-24 DIAGNOSIS — E7800 Pure hypercholesterolemia, unspecified: Secondary | ICD-10-CM

## 2013-05-24 LAB — COMPREHENSIVE METABOLIC PANEL
ALT: 15 U/L (ref 0–53)
AST: 25 U/L (ref 0–37)
Alkaline Phosphatase: 78 U/L (ref 39–117)
BUN: 12 mg/dL (ref 6–23)
Calcium: 8.9 mg/dL (ref 8.4–10.5)
Creatinine, Ser: 1.3 mg/dL (ref 0.4–1.5)
Total Bilirubin: 0.3 mg/dL (ref 0.3–1.2)

## 2013-05-24 LAB — LIPID PANEL
HDL: 41.4 mg/dL (ref >39.00)
LDL Cholesterol: 90 mg/dL (ref 0–99)
Total CHOL/HDL Ratio: 3
Triglycerides: 52 mg/dL (ref 0.0–149.0)
VLDL: 10.4 mg/dL (ref 0.0–40.0)

## 2013-05-24 NOTE — Telephone Encounter (Signed)
Message copied by Excell Seltzer on Fri May 24, 2013  2:17 PM ------      Message from: Alvina Chou      Created: Wed May 15, 2013  5:41 PM      Regarding: Lab orders for Friday, 10.17.14       Patient is scheduled for CPX labs, please order future labs, Thanks , Terri       ------

## 2013-05-31 ENCOUNTER — Ambulatory Visit (INDEPENDENT_AMBULATORY_CARE_PROVIDER_SITE_OTHER): Payer: Medicare Other | Admitting: Family Medicine

## 2013-05-31 ENCOUNTER — Encounter: Payer: Self-pay | Admitting: Family Medicine

## 2013-05-31 VITALS — BP 110/58 | HR 98 | Temp 98.2°F | Ht 66.0 in | Wt 162.2 lb

## 2013-05-31 DIAGNOSIS — E7800 Pure hypercholesterolemia, unspecified: Secondary | ICD-10-CM

## 2013-05-31 DIAGNOSIS — Z0000 Encounter for general adult medical examination without abnormal findings: Secondary | ICD-10-CM

## 2013-05-31 DIAGNOSIS — I10 Essential (primary) hypertension: Secondary | ICD-10-CM

## 2013-05-31 NOTE — Patient Instructions (Addendum)
Return for medicare wellness with labs prior in 1 year. Work on exercise, weight loss, healthy eating habits. Great job on quitting smoking... Now stop dip!

## 2013-05-31 NOTE — Progress Notes (Signed)
HPI  The patient is here for annual wellness exam and preventative care.  I have personally reviewed the Medicare Annual Wellness questionnaire and have noted 1. The patient's medical and social history 2. Their use of alcohol, tobacco or illicit drugs 3. Their current medications and supplements 4. The patient's functional ability including ADL's, fall risks, home safety risks and hearing or visual             impairment. 5. Diet and physical activities 6. Evidence for depression or mood disorders The patients weight, height, BMI and visual acuity have been recorded in the chart I have made referrals, counseling and provided education to the patient based review of the above and I have provided the pt with a written personalized care plan for preventive services.  Since last OV he has had a spinal decompression and laminectomy 02/2013 in lumbar spine for low back pain, radiculopathy: Dr.  Dutch Quint.  He is doing well and only has minimal pain.  Hypertension: Well controlled on amlodipine  BP Readings from Last 3 Encounters:  05/31/13 110/58  02/14/13 111/66  02/14/13 111/66  Using medication without problems or lightheadedness: None  Chest pain with exertion:None  Edema:None  Short of breath:None Average home BPs: not checking.  Other issues:   Elevated Cholesterol: Well controlled on no medication.  Lab Results  Component Value Date   CHOL 142 05/24/2013   HDL 41.40 05/24/2013   LDLCALC 90 05/24/2013   TRIG 52.0 05/24/2013   CHOLHDL 3 05/24/2013   Diet compliance: Moderate, skips meals, eat at night.  Exercise:Wlaking, limited due to recent back surgery..  Other complaints:   COPD, moderate Continues smoking. Moderate COPD, daily cough.  Quit smoking in 12/2012.. Still using smokeless tobacco. Less SOB now off cigarette. Was not interested in spiriva in past. Last PFTs 2012.   Review of Systems  Constitutional: Negative for fever, fatigue and unexpected weight change.   HENT: Negative for ear pain, congestion, sore throat, rhinorrhea, trouble swallowing and postnasal drip.  Eyes: Negative for pain.  Respiratory: Negative for cough, shortness of breath and wheezing.  Cardiovascular: Negative for chest pain, palpitations and leg swelling.  Gastrointestinal: Positive for constipation. Negative for nausea, abdominal pain, diarrhea and blood in stool.  Genitourinary: Negative for dysuria, urgency, hematuria, discharge, penile swelling, scrotal swelling, difficulty urinating, penile pain and testicular pain.  Skin: Negative for rash.  Neurological: Negative for syncope, weakness, light-headedness, numbness and headaches.  Psychiatric/Behavioral: Negative for behavioral problems and dysphoric mood. The patient is not nervous/anxious.  Objective:   Physical Exam  Constitutional: He appears well-developed and well-nourished. Non-toxic appearance. He does not appear ill. No distress.  HENT:  Head: Normocephalic and atraumatic.  Right Ear: Hearing, tympanic membrane, external ear and ear canal normal.  Left Ear: Hearing, tympanic membrane, external ear and ear canal normal.  Nose: Nose normal.  Mouth/Throat: Uvula is midline, oropharynx is clear and moist and mucous membranes are normal.  Eyes: Conjunctivae, EOM and lids are normal. Pupils are equal, round, and reactive to light. No foreign bodies found.  Neck: Trachea normal, normal range of motion and phonation normal. Neck supple. Carotid bruit is not present. No mass and no thyromegaly present.  Cardiovascular: Normal rate, regular rhythm, S1 normal, S2 normal, intact distal pulses and normal pulses. Exam reveals no gallop.  No murmur heard.  Pulmonary/Chest: Breath sounds normal. He has no wheezes. He has no rhonchi. He has no rales.  Abdominal: Soft. Normal appearance and bowel sounds are normal. There  is no hepatosplenomegaly. There is no tenderness. There is no rebound, no guarding and no CVA tenderness. A  hernia is present. Hernia confirmed positive in the right inguinal area. Hernia confirmed negative in the left inguinal area.  Genitourinary: Prostate normal, testes normal and penis normal. Rectal exam shows no external hemorrhoid, no internal hemorrhoid, no fissure, no mass, no tenderness and anal tone normal. Guaiac negative stool. Prostate is not enlarged and not tender. Right testis shows no mass and no tenderness. Left testis shows no mass and no tenderness. No paraphimosis or penile tenderness.  Lymphadenopathy:  He has no cervical adenopathy.  Right: No inguinal adenopathy present.  Left: No inguinal adenopathy present.  Neurological: He is alert. He has normal strength and normal reflexes. No cranial nerve deficit or sensory deficit. Gait normal.  Skin: Skin is warm, dry and intact. No rash noted.  Psychiatric: He has a normal mood and affect. His speech is normal and behavior is normal. Judgment normal.  Assessment & Plan:   The patient's preventative maintenance and recommended screening tests for an annual wellness exam were reviewed in full today.  Brought up to date unless services declined.  Counselled on the importance of diet, exercise, and its role in overall health and mortality.  The patient's FH and SH was reviewed, including their home life, tobacco status, and drug and alcohol status.   Vaccines:Uptodate with Td, due for zostavax (not interested) and PNA (not interested), refuses flu as well QUIT smoking, still using dip:CXR last done with ONC 12/2011 nodule resolved, last PFTs showed moderate COPD: was not interested in spiriva at that time.  Colonoscopy Result Date: 12/10/2009  Colonoscopy Result: polyps, hyperplastic  Colonoscopy Next Due: 5 yr (2016)  PSA: prostate exam today  Lab Results  Component Value Date   PSA 0.53 05/24/2013   PSA 0.64 08/02/2012   PSA 0.87 02/03/2012

## 2013-05-31 NOTE — Assessment & Plan Note (Signed)
Well controlled. Continue current medication.  

## 2013-06-03 IMAGING — CR DG CHEST 2V
1 series · 2 of 2 positions shown · non-contrast
Comparison: none

REASON FOR EXAM: [DATE] Malignant neoplasm of kidney
COMMENTS:

[Series 1: view not recorded · 0.17mm/px · 2 of 2 slices shown]
[im 1/2]
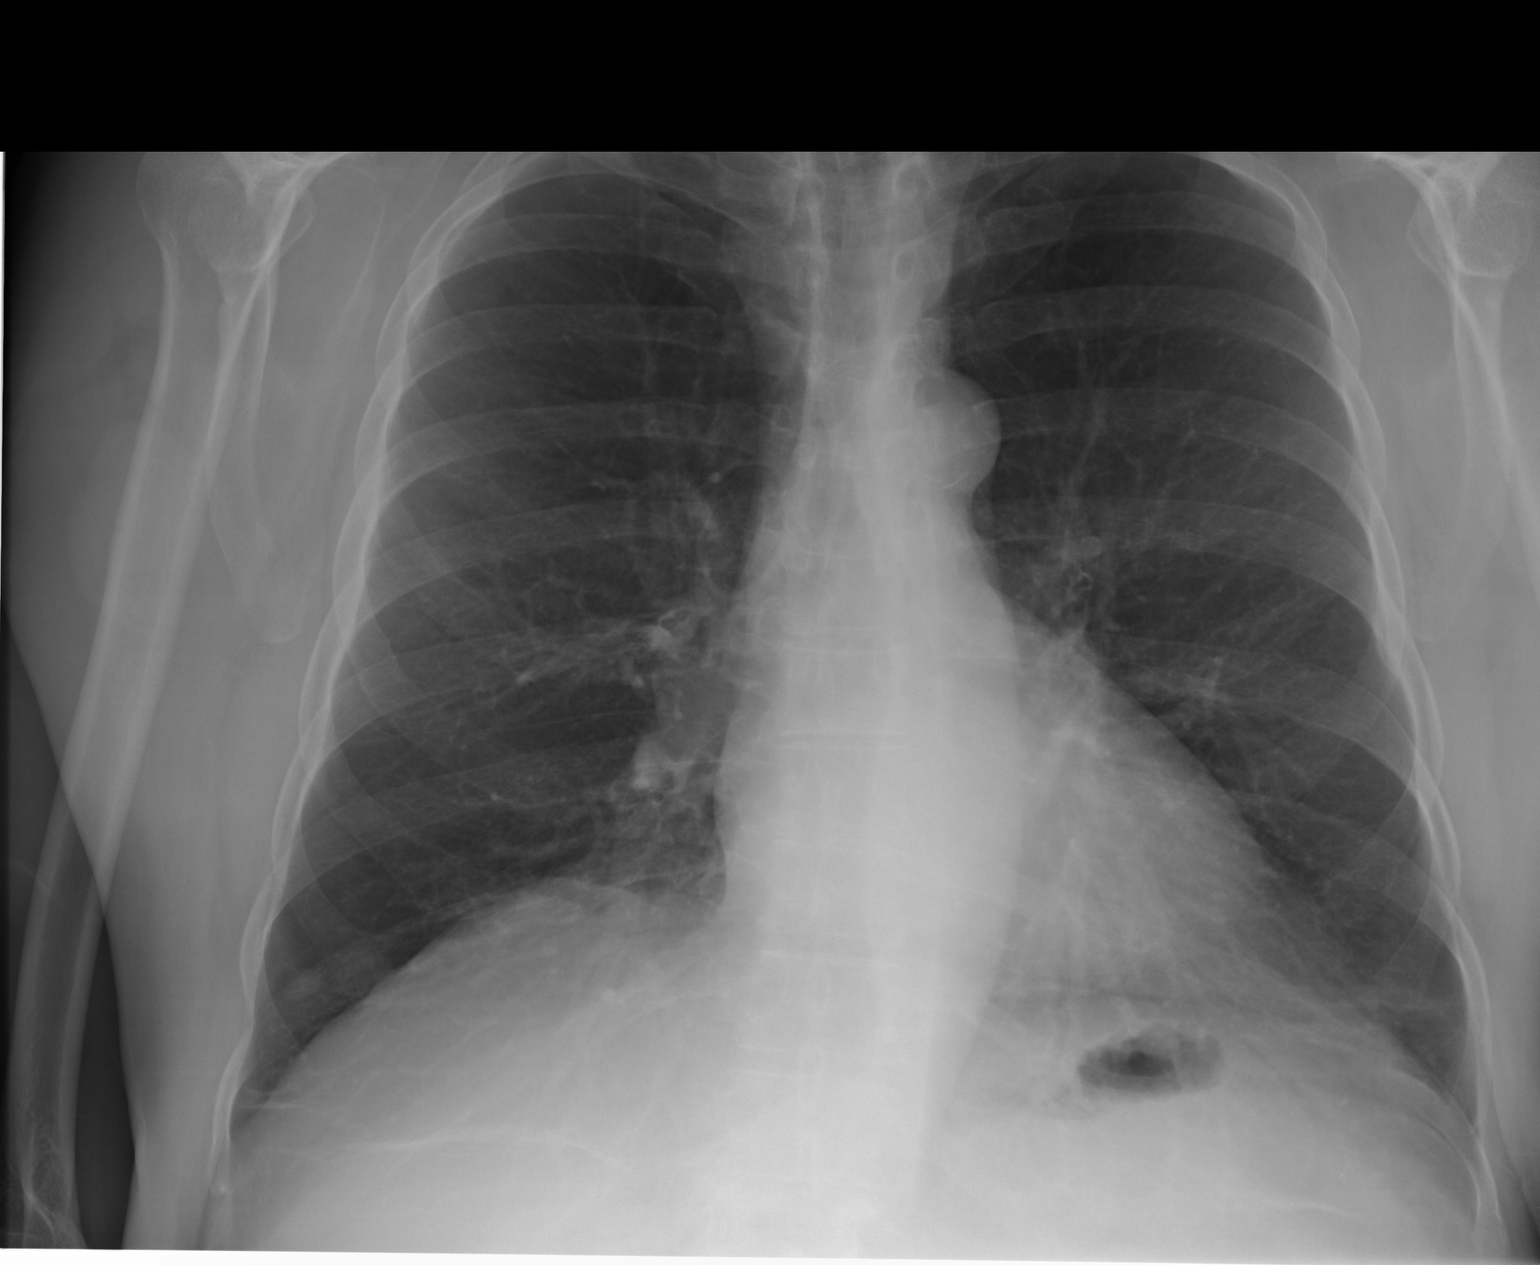
[im 2/2]
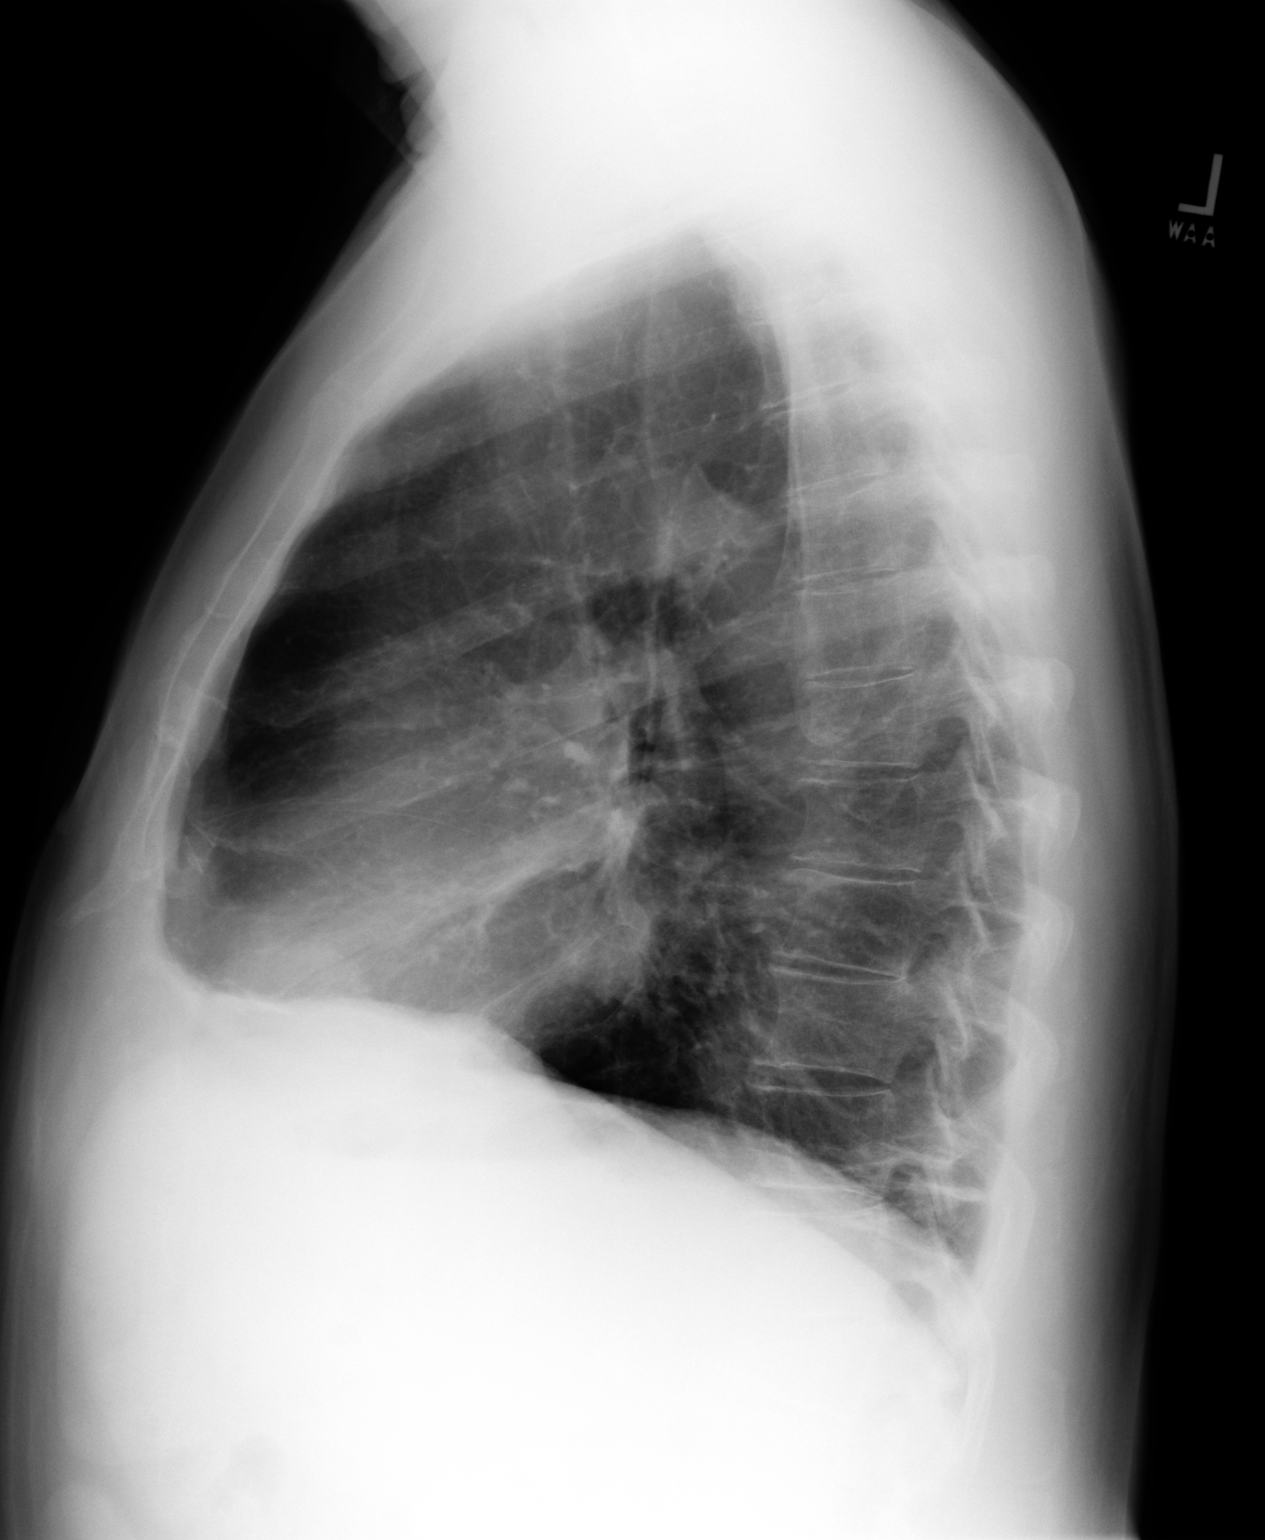

[2 of 2 positions shown; findings below may reference images not displayed]

PROCEDURE:     DXR - DXR CHEST PA (OR AP) AND LATERAL  - January 24, 2011 [DATE]

RESULT:     PA and lateral views of the chest were obtained. There is a
faint 1 cm nodule projected at the right lower lobe near the level of the
right costophrenic angle. This may be related to the pleural based nodule
previously observed at CT, but this is not definite. In view of the
patient's history additional evaluation by Chest CT is suggested. The lung
fields otherwise are clear. No pneumonia is seen. No pleural effusion is
noted. Heart size is normal. Postoperative changes of prior lower cervical
spine surgery are noted.
IMPRESSION: 1. Possible nodule at the right lung base.
2. The lung fields are otherwise clear.
3. Heart size is normal.

## 2013-06-24 ENCOUNTER — Other Ambulatory Visit: Payer: Self-pay | Admitting: *Deleted

## 2013-06-24 MED ORDER — AMLODIPINE BESYLATE 5 MG PO TABS: 5.0000 mg | ORAL_TABLET | Freq: Every day | ORAL | Status: DC

## 2013-06-24 MED ORDER — LOSARTAN POTASSIUM 100 MG PO TABS: 100.0000 mg | ORAL_TABLET | Freq: Every day | ORAL | Status: DC

## 2013-12-17 ENCOUNTER — Other Ambulatory Visit: Payer: Self-pay | Admitting: Family Medicine

## 2014-06-27 ENCOUNTER — Other Ambulatory Visit: Payer: Self-pay | Admitting: Family Medicine

## 2014-08-18 ENCOUNTER — Telehealth: Payer: Self-pay | Admitting: Family Medicine

## 2014-08-18 DIAGNOSIS — E7800 Pure hypercholesterolemia, unspecified: Secondary | ICD-10-CM

## 2014-08-18 DIAGNOSIS — Z125 Encounter for screening for malignant neoplasm of prostate: Secondary | ICD-10-CM

## 2014-08-18 NOTE — Telephone Encounter (Signed)
-----   Message from Ellamae Sia sent at 08/13/2014  3:33 PM EST ----- Regarding: Lab orders for Tuesday, 1.12.16 Patient is scheduled for CPX labs, please order future labs, Thanks , Karna Christmas

## 2014-08-19 ENCOUNTER — Other Ambulatory Visit (INDEPENDENT_AMBULATORY_CARE_PROVIDER_SITE_OTHER): Payer: Medicare Other

## 2014-08-19 DIAGNOSIS — E7800 Pure hypercholesterolemia, unspecified: Secondary | ICD-10-CM

## 2014-08-19 DIAGNOSIS — Z125 Encounter for screening for malignant neoplasm of prostate: Secondary | ICD-10-CM

## 2014-08-19 LAB — LIPID PANEL
Cholesterol: 183 mg/dL (ref 0–200)
HDL: 32.7 mg/dL — AB (ref >39.00)
LDL Cholesterol: 132 mg/dL — ABNORMAL HIGH (ref 0–99)
NONHDL: 150.3
Total CHOL/HDL Ratio: 6
Triglycerides: 94 mg/dL (ref 0.0–149.0)
VLDL: 18.8 mg/dL (ref 0.0–40.0)

## 2014-08-19 LAB — COMPREHENSIVE METABOLIC PANEL
ALT: 15 U/L (ref 0–53)
AST: 17 U/L (ref 0–37)
Albumin: 4.1 g/dL (ref 3.5–5.2)
Alkaline Phosphatase: 61 U/L (ref 39–117)
BUN: 18 mg/dL (ref 6–23)
CALCIUM: 8.9 mg/dL (ref 8.4–10.5)
CHLORIDE: 101 meq/L (ref 96–112)
CO2: 33 mEq/L — ABNORMAL HIGH (ref 19–32)
Creatinine, Ser: 1.2 mg/dL (ref 0.4–1.5)
GFR: 64.9 mL/min (ref >60.00)
Glucose, Bld: 108 mg/dL — ABNORMAL HIGH (ref 70–99)
POTASSIUM: 4.5 meq/L (ref 3.5–5.1)
Sodium: 140 mEq/L (ref 135–145)
Total Bilirubin: 0.7 mg/dL (ref 0.2–1.2)
Total Protein: 6.7 g/dL (ref 6.0–8.3)

## 2014-08-19 LAB — PSA, MEDICARE: PSA: 0.85 ng/mL (ref 0.10–4.00)

## 2014-08-26 ENCOUNTER — Ambulatory Visit (INDEPENDENT_AMBULATORY_CARE_PROVIDER_SITE_OTHER): Payer: Medicare Other | Admitting: Family Medicine

## 2014-08-26 ENCOUNTER — Encounter: Payer: Self-pay | Admitting: Family Medicine

## 2014-08-26 VITALS — BP 100/60 | HR 95 | Temp 98.5°F | Ht 66.0 in | Wt 175.5 lb

## 2014-08-26 DIAGNOSIS — G4733 Obstructive sleep apnea (adult) (pediatric): Secondary | ICD-10-CM | POA: Insufficient documentation

## 2014-08-26 DIAGNOSIS — I10 Essential (primary) hypertension: Secondary | ICD-10-CM

## 2014-08-26 DIAGNOSIS — F17200 Nicotine dependence, unspecified, uncomplicated: Secondary | ICD-10-CM

## 2014-08-26 DIAGNOSIS — Z7189 Other specified counseling: Secondary | ICD-10-CM

## 2014-08-26 DIAGNOSIS — Z0000 Encounter for general adult medical examination without abnormal findings: Secondary | ICD-10-CM

## 2014-08-26 NOTE — Assessment & Plan Note (Signed)
Refer for re-eval for restart CPAP.

## 2014-08-26 NOTE — Progress Notes (Signed)
Pre visit review using our clinic review tool, if applicable. No additional management support is needed unless otherwise documented below in the visit note. 

## 2014-08-26 NOTE — Assessment & Plan Note (Signed)
Well controlled. Continue current medication.  

## 2014-08-26 NOTE — Progress Notes (Signed)
HPI  The patient is here for annual wellness exam and preventative care.  I have personally reviewed the Medicare Annual Wellness questionnaire and have noted 1.The patient's medical and social history 2.Their use of alcohol, tobacco or illicit drugs 3.Their current medications and supplements 4.The patient's functional ability including ADL's, fall risks, home safety risks and hearing or visual  impairment. 5.Diet and physical activities 6.Evidence for depression or mood disorders The patients weight, height, BMI and visual acuity have been recorded in the chart I have made referrals, counseling and provided education to the patient based review of the above and I have provided the pt with a written personalized care plan for preventive services.  Spinal decompression and laminectomy 02/2013 in lumbar spine for low back pain, radiculopathy: Dr. Trenton Gammon.  He has continued to have pain in his back.  He also has pain  Radiate into  right leg . Using hydrocodone per Dr. Trenton Gammon for pain.  He was diagnosed in past with sleep study last done 2006. Prescribed CPAP, but had trouble wearing. Wife says he contnues to have apenic speel and night and when falling asleep on cough.  Poor unrestful sleep fatigued during the day..  Hypertension: Well controlled on amlodipine  BP Readings from Last 3 Encounters:  08/26/14 100/60  05/31/13 110/58  02/14/13 111/66  Using medication without problems or lightheadedness: None  Chest pain with exertion:None  Edema:None  Short of breath:stable Average home BPs: not checking.  Other issues:  Not exercising and feels out of shape  Elevated Cholesterol: Well controlled on no medication.  Lab Results  Component Value Date   CHOL 183 08/19/2014   HDL 32.70* 08/19/2014   LDLCALC 132* 08/19/2014   TRIG 94.0 08/19/2014   CHOLHDL 6 08/19/2014  Diet compliance: Moderate, skips meals, eat at  night.  Exercise:limited Other complaints:    Prediabetes:  Continued issue. Drinking mountain dew 2 cans a day.  COPD, moderate, daily cough.  He is smoking  Under a pack a day. Was not interested in spiriva in past. Last PFTs 2012.  Review of Systems  Constitutional: Negative for fever, fatigue and unexpected weight change.  HENT: Negative for ear pain, congestion, sore throat, rhinorrhea, trouble swallowing and postnasal drip.  Eyes: Negative for pain.  Respiratory: Negative for cough, shortness of breath and wheezing.  Cardiovascular: Negative for chest pain, palpitations and leg swelling.  Gastrointestinal: Positive for constipation. Negative for nausea, abdominal pain, diarrhea and blood in stool.  Genitourinary: Negative for dysuria, urgency, hematuria, discharge, penile swelling, scrotal swelling, difficulty urinating, penile pain and testicular pain.  Skin: Negative for rash.  Neurological: Negative for syncope, weakness, light-headedness, numbness and headaches.  Psychiatric/Behavioral: Negative for behavioral problems and dysphoric mood. The patient is not nervous/anxious.  Objective:   Physical Exam  Constitutional: He appears well-developed and well-nourished. Non-toxic appearance. He does not appear ill. No distress.  HENT:  Head: Normocephalic and atraumatic.  Right Ear: Hearing, tympanic membrane, external ear and ear canal normal.  Left Ear: Hearing, tympanic membrane, external ear and ear canal normal.  Nose: Nose normal.  Mouth/Throat: Uvula is midline, oropharynx is clear and moist and mucous membranes are normal.  Eyes: Conjunctivae, EOM and lids are normal. Pupils are equal, round, and reactive to light. No foreign bodies found.  Neck: Trachea normal, normal range of motion and phonation normal. Neck supple. Carotid bruit is not present. No mass and no thyromegaly present.  Cardiovascular: Normal rate, regular rhythm, S1 normal, S2 normal,  intact distal  pulses and normal pulses. Exam reveals no gallop.  No murmur heard.  Pulmonary/Chest: Breath sounds normal. He has no wheezes. He has no rhonchi. He has no rales.  Abdominal: Soft. Normal appearance and bowel sounds are normal. There is no hepatosplenomegaly. There is no tenderness. There is no rebound, no guarding and no CVA tenderness. A hernia is present. Hernia confirmed positive in the right inguinal area. Hernia confirmed negative in the left inguinal area.  Genitourinary: Prostate normal, testes normal and penis normal. Rectal exam shows no external hemorrhoid, no internal hemorrhoid, no fissure, no mass, no tenderness and anal tone normal. Guaiac negative stool. Prostate is not enlarged and not tender. Right testis shows no mass and no tenderness. Left testis shows no mass and no tenderness. No paraphimosis or penile tenderness.  Lymphadenopathy:  He has no cervical adenopathy.  Right: No inguinal adenopathy present.  Left: No inguinal adenopathy present.  Neurological: He is alert. He has normal strength and normal reflexes. No cranial nerve deficit or sensory deficit. Gait normal.  Skin: Skin is warm, dry and intact. No rash noted.  Psychiatric: He has a normal mood and affect. His speech is normal and behavior is normal. Judgment normal.  Assessment & Plan:   The patient's preventative maintenance and recommended screening tests for an annual wellness exam were reviewed in full today.  Brought up to date unless services declined.  Counselled on the importance of diet, exercise, and its role in overall health and mortality.  The patient's FH and SH was reviewed, including their home life, tobacco status, and drug and alcohol status.  Vaccines:Uptodate with Td, due for zostavax (not interested) and PNA (not interested), refuses flu as well Needs to quit  Smoking: Has smoked 50-80 pack year history. CXR last done with ONC 12/2011 nodule resolved, last PFTs showed  moderate COPD: was not interested in spiriva at that time.  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

## 2014-08-26 NOTE — Patient Instructions (Addendum)
Stop at front desk on way out for sleep eval referral.  Exercise as tolerated. Water exercise. Work on low [employer]. Avoid bread, pasta, potatos, rice, sweets, juice , soda.  Stop mountain dew.  Quit smoking, let me know when you are ready to quit.  Stop at front desk for  CT cancer screen.  Expect a call from Dr. Watt Climes for colonoscopy this year for colon cancer screen.

## 2014-08-27 ENCOUNTER — Telehealth: Payer: Self-pay | Admitting: Family Medicine

## 2014-08-27 NOTE — Telephone Encounter (Signed)
emmi mailed  °

## 2014-09-15 ENCOUNTER — Ambulatory Visit (INDEPENDENT_AMBULATORY_CARE_PROVIDER_SITE_OTHER)
Admission: RE | Admit: 2014-09-15 | Discharge: 2014-09-15 | Disposition: A | Discharge status: Home / Self Care | Payer: Medicare Other | Source: Ambulatory Visit | Attending: Family Medicine | Admitting: Family Medicine

## 2014-09-15 DIAGNOSIS — F17200 Nicotine dependence, unspecified, uncomplicated: Secondary | ICD-10-CM

## 2014-09-15 DIAGNOSIS — Z720 Tobacco use: Secondary | ICD-10-CM

## 2014-09-20 ENCOUNTER — Other Ambulatory Visit: Payer: Self-pay | Admitting: Family Medicine

## 2014-09-26 ENCOUNTER — Other Ambulatory Visit: Payer: Self-pay | Admitting: Acute Care

## 2014-10-06 ENCOUNTER — Ambulatory Visit (INDEPENDENT_AMBULATORY_CARE_PROVIDER_SITE_OTHER): Payer: Medicare Other | Admitting: Pulmonary Disease

## 2014-10-06 ENCOUNTER — Encounter: Payer: Self-pay | Admitting: Pulmonary Disease

## 2014-10-06 VITALS — BP 124/70 | HR 85 | Temp 98.0°F | Ht 67.0 in | Wt 178.2 lb

## 2014-10-06 DIAGNOSIS — G4733 Obstructive sleep apnea (adult) (pediatric): Secondary | ICD-10-CM

## 2014-10-06 NOTE — Assessment & Plan Note (Signed)
The patient tells me that he was diagnosed with obstructive sleep apnea in 2000, and did well on C Pap for about one year. He quit using his device one day because of aggravation more than anything else, and is now having worsening symptoms of sleep disordered breathing. Because he has had a break in therapy, he will need a repeat sleep study to verify that he still has sleep apnea, and then we can arrange to restart C Pap. I've also encouraged him to work aggressively on weight loss.

## 2014-10-06 NOTE — Progress Notes (Signed)
Subjective:    Patient ID: Pedro [name], male    DOB: 02-01-1946, 69 y.o.   MRN: [id number]  HPI The patient is a 69 year old male who I've been asked to see for management of obstructive sleep apnea. He was apparently diagnosed in 2000 with sleep apnea, and was started on C Pap and did well. He wore this for about a year, but discontinued spontaneously 1 day because of aggravation. Unfortunately, none of his records are available, nor are his sleep studies. He is now having worsening symptoms, with loud snoring and witnessed apneas. He has frequent awakenings at night, and is not rested in the mornings upon arising. Definite sleep pressure during the day with inactivity, and will fall asleep at night trying to watch television or movies. He denies any issues with driving except on longer distances. His weight is up about 20 pounds over the last 2 years, and his Epworth score today is 7.   Sleep Questionnaire What time do you typically go to bed?( Between what hours) 10-12 10-12 at 1439 on 10/06/14 by Inge Rise, CMA How long does it take you to fall asleep? 15-30 min w/ medication 15-30 min w/ medication at 1439 on 10/06/14 by Inge Rise, CMA How many times during the night do you wake up? 1 1 at 1439 on 10/06/14 by Inge Rise, [place] What time do you get out of bed to start your day? 0800 0800 at 1439 on 10/06/14 by Inge Rise, CMA Do you drive or operate heavy machinery in your occupation? No No at 1439 on 10/06/14 by Inge Rise, CMA How much has your weight changed (up or down) over the past two years? (In pounds) 20 lb (9.072 kg) 20 lb (9.072 kg) at 1439 on 10/06/14 by Inge Rise, CMA Have you ever had a sleep study before? Yes Yes at 1439 on 10/06/14 by Inge Rise, CMA If yes, location of study? [address] at 1439 on 10/06/14 by Inge Rise, CMA If yes, date of study? 15 years ago 15 years ago at 1439 on 10/06/14 by Inge Rise, CMA Do you  currently use CPAP? No No at 1439 on 10/06/14 by Inge Rise, CMA Do you wear oxygen at any time? No No at 1439 on 10/06/14 by Inge Rise, CMA   Review of Systems  Constitutional: Negative for fever and unexpected weight change.  HENT: Negative for congestion, dental problem, ear pain, nosebleeds, postnasal drip, rhinorrhea, sinus pressure, sneezing, sore throat and trouble swallowing.   Eyes: Negative for redness and itching.  Respiratory: Positive for cough and shortness of breath. Negative for chest tightness and wheezing.   Cardiovascular: Negative for palpitations and leg swelling.  Gastrointestinal: Negative for nausea and vomiting.  Genitourinary: Negative for dysuria.  Musculoskeletal: Negative for joint swelling.  Skin: Negative for rash.  Neurological: Negative for headaches.  Hematological: Does not bruise/bleed easily.  Psychiatric/Behavioral: Negative for dysphoric mood. The patient is not nervous/anxious.        Objective:   Physical Exam Constitutional:  Overweight male, no acute distress  HENT:  Nares patent without discharge  Oropharynx without exudate, palate and uvula are thick and elongated  Eyes:  Perrla, eomi, no scleral icterus  Neck:  No JVD, no TMG  Cardiovascular:  Normal rate, regular rhythm, no rubs or gallops.  No murmurs        Intact distal pulses  Pulmonary :  decreased breath sounds,  no stridor or respiratory distress   No rales, rhonchi, or wheezing  Abdominal:  Soft, nondistended, bowel sounds present.  No tenderness noted.   Musculoskeletal:  minimal lower extremity edema noted.  Lymph Nodes:  No cervical lymphadenopathy noted  Skin:  No cyanosis noted  Neurologic:  Appears sleepy, but appropriate, moves all 4 extremities without obvious deficit.         Assessment & Plan:

## 2014-10-06 NOTE — Patient Instructions (Signed)
Will schedule for sleep study, and will get you back on cpap once the results are available. Work on [employer] loss Will arrange followup with me once we have gotten you back on cpap .

## 2014-10-14 ENCOUNTER — Other Ambulatory Visit: Payer: Self-pay | Admitting: Neurosurgery

## 2014-10-14 DIAGNOSIS — M48061 Spinal stenosis, lumbar region without neurogenic claudication: Secondary | ICD-10-CM

## 2014-10-22 ENCOUNTER — Telehealth: Payer: Self-pay | Admitting: Family Medicine

## 2014-10-31 ENCOUNTER — Other Ambulatory Visit: Payer: Medicare Other

## 2014-11-07 NOTE — Telephone Encounter (Signed)
Received call of pt demise from [facility]. We will sign the patient's death certificate - attn. Dr. Diona Browner.  He was sick the last few days per EMS, worsened and did not get out of bed.

## 2014-11-07 NOTE — Telephone Encounter (Signed)
Noted  

## 2014-11-07 DEATH — deceased

## 2014-12-09 ENCOUNTER — Encounter (HOSPITAL_BASED_OUTPATIENT_CLINIC_OR_DEPARTMENT_OTHER): Payer: Medicare Other

## 2015-09-03 ENCOUNTER — Encounter: Payer: Medicare Other | Admitting: Family Medicine

## 2017-01-23 IMAGING — CT CT CHEST LUNG CANCER SCREENING LOW DOSE W/O CM
1 of 4 series · 14 of 40 positions shown, 18 images · non-contrast
Comparison: Correlation with prior CT report dated 06/02/1999.

CLINICAL DATA: 68-year-old male with greater than 40 pack year
history of smoking, current smoker, for initial lung cancer
screening.

EXAM:
CT CHEST WITHOUT CONTRAST
TECHNIQUE: Multidetector CT imaging of the chest was performed following the
standard protocol without IV contrast..

[Series 2: chest routine with · axial · 0.78mm/px · z∈[-330,-70]mm · 14 of 60 slices shown, 18 images]
[im 4/60  mediastinal]
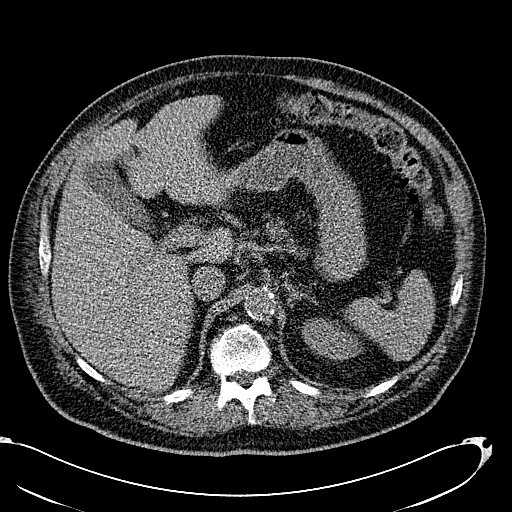
[im 4/60  lung]
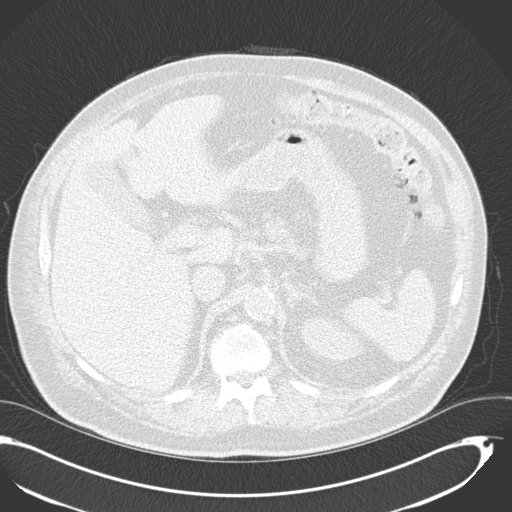
[im 7/60  lung]
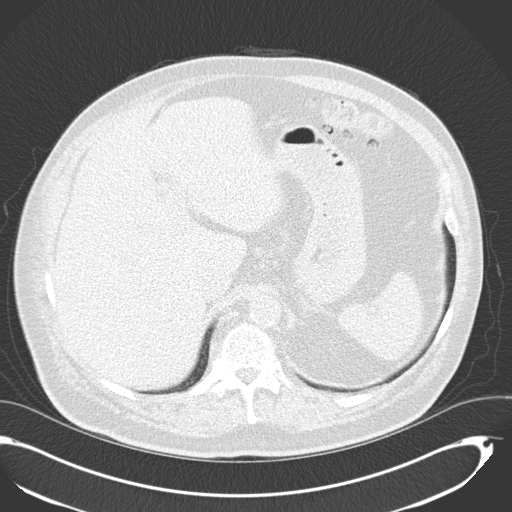
[im 14/60  lung]
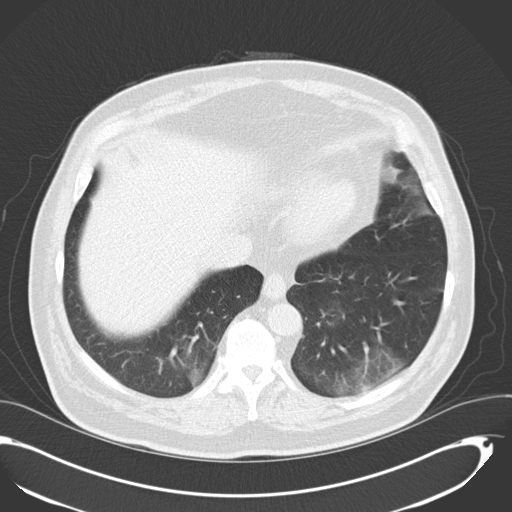
[im 18/60  lung]
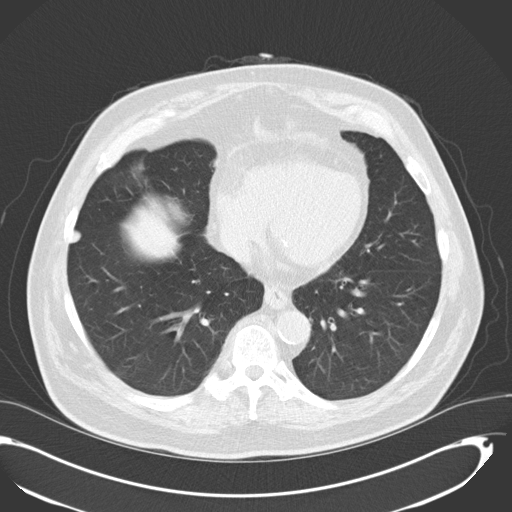
[im 21/60  mediastinal]
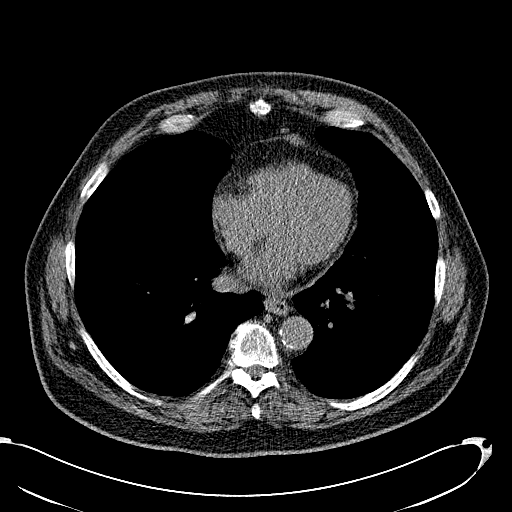
[im 21/60  lung]
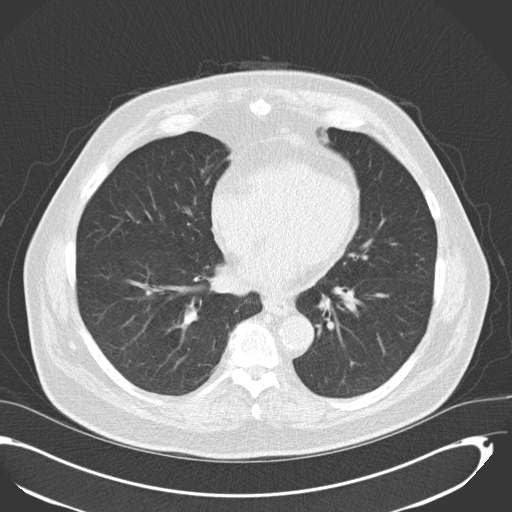
[im 28/60  lung]
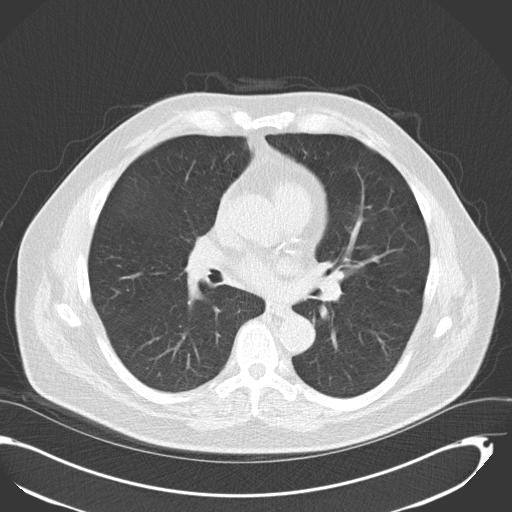
[im 29/60  lung]
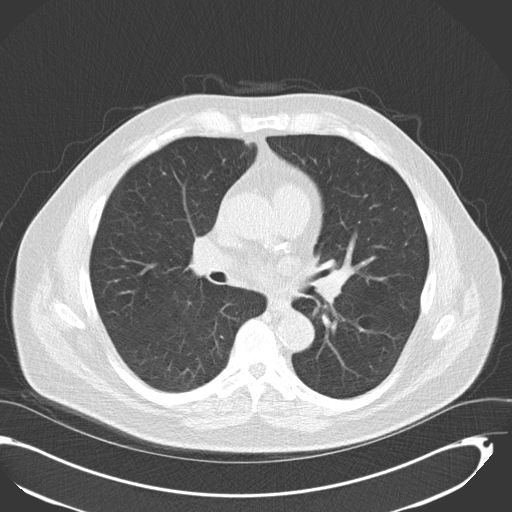
[im 30/60  lung]
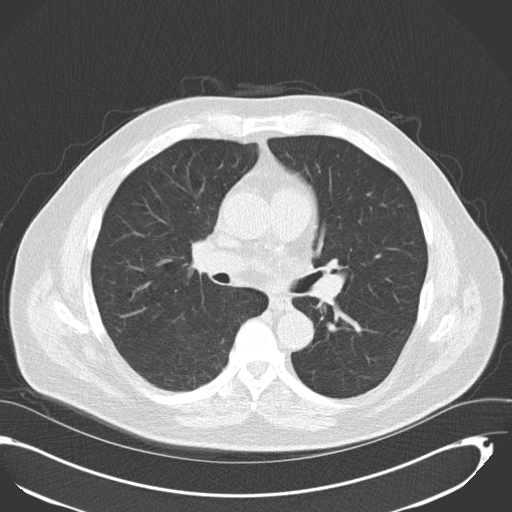
[im 32/60  mediastinal]
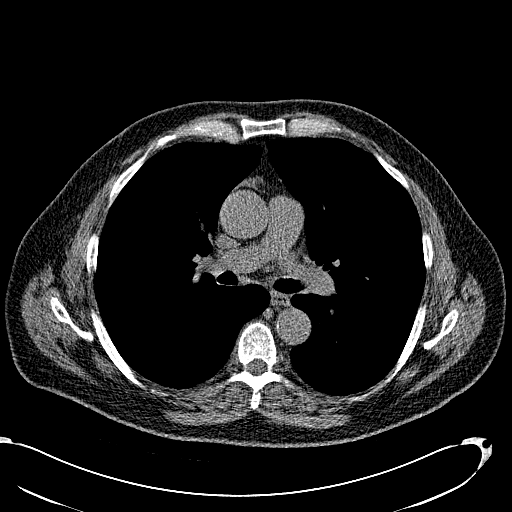
[im 32/60  lung]
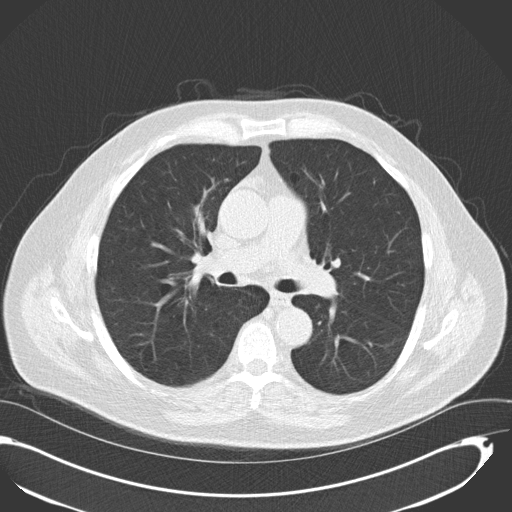
[im 39/60  lung]
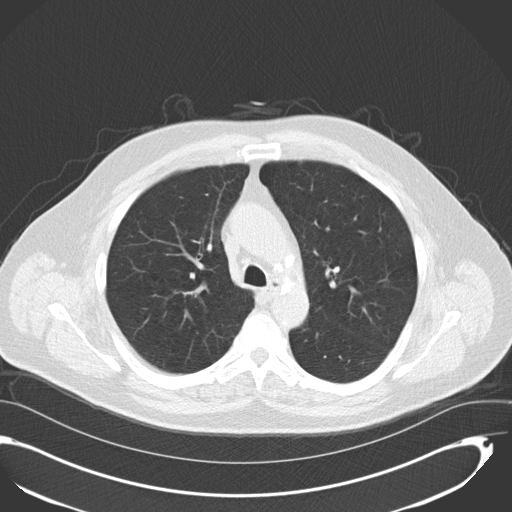
[im 42/60  lung]
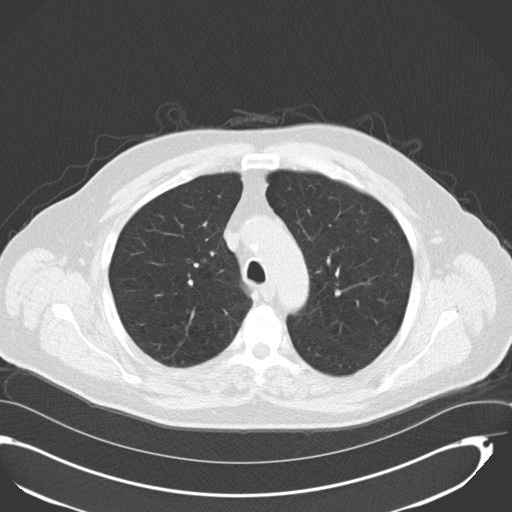
[im 46/60  lung]
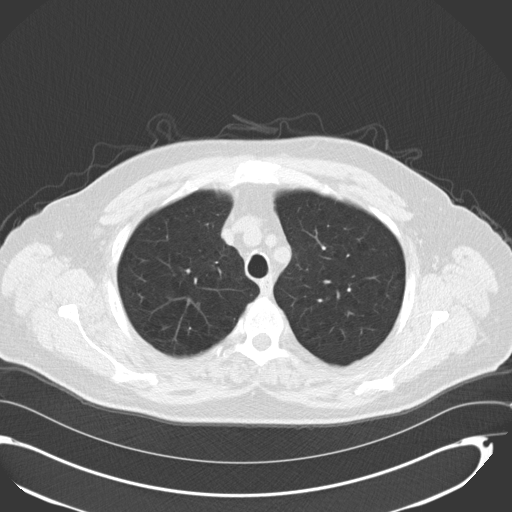
[im 53/60  mediastinal]
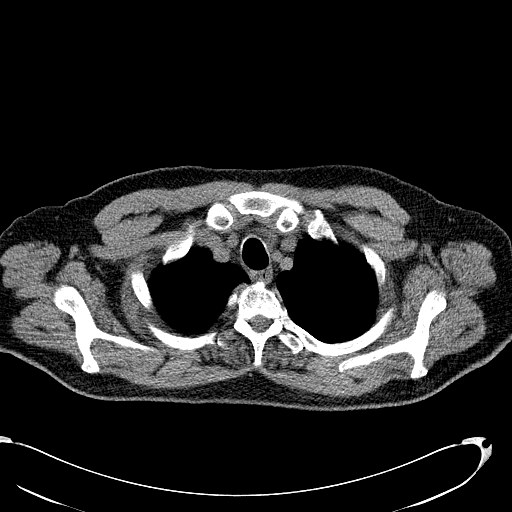
[im 53/60  lung]
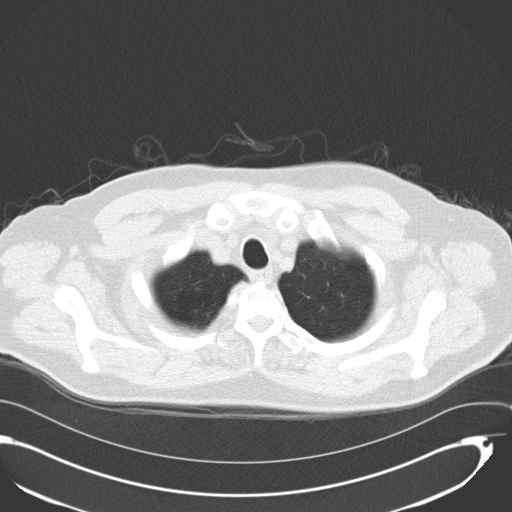
[im 56/60  lung]
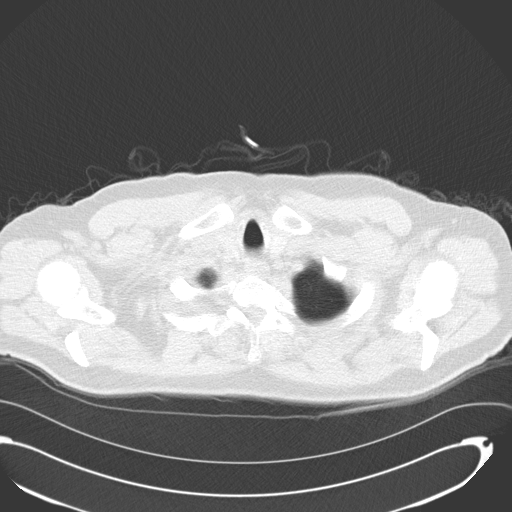

[14 of 40 positions shown; findings below may reference images not displayed]

FINDINGS: Mediastinum/Nodes: Heart is normal in size. No pericardial effusion.

Left main and 3 vessel coronary atherosclerosis.

Atherosclerotic calcifications of the aortic arch. Mild ectasia of
the ascending thoracic aorta, measuring 3.5 cm.

No suspicious mediastinal or axillary lymphadenopathy.

Visualized thyroid is unremarkable.

Lungs/Pleura: 11.5 x 7.5 mm (volumetric mean 9 mm) pleural-based
nodule in the right lower lobe along the right minor fissure (image
[id number]). Smooth borders without spiculation.

Please note that a prior CT chest report from 1777 describes a
pleural-based nodule in this location, reportedly measuring 6 x 8
mm. Those images are not available for direct comparison.

Additional 2 mm calcified granuloma in the left lower lobe.

Underlying mild centrilobular and paraseptal emphysematous changes.

No pleural effusion or pneumothorax.

Upper abdomen: Visualized upper abdomen is notable for vascular
calcifications and an 11 mm calcified lesion along the right adrenal
gland, likely sequela of prior trauma or infection.

Musculoskeletal: Degenerative changes of the thoracic spine.
Cervical spine fixation hardware, incompletely visualized.
IMPRESSION: 9 mm pleural-based/perifissural nodule in the right lower lobe along
the right minor fissure.

By size, this reflects a Lung-RADS Category 4A lesion. However, the
location/appearance favors a benign etiology. Additionally, a prior
1777 CT chest report notes a pleural-based nodule in this
approximate location (although with slightly smaller measurements).

Given this, follow up low-dose chest CT without contrast is
suggested in 3 months to assess for stability. (Please use the
following order, "CT chest low-dose screening follow-up").
Alternatively, if there is high clinical concern, consider PET-CT as
clinically warranted.
